# Patient Record
Sex: Female | Born: 2000 | Race: Black or African American | Hispanic: No | Marital: Single | Smoking: Current every day smoker
Health system: Southern US, Community
[De-identification: ages and names within clinical notes are randomized; demographics above are authoritative.]

## PROBLEM LIST (undated history)

## (undated) DIAGNOSIS — E611 Iron deficiency: Secondary | ICD-10-CM

## (undated) DIAGNOSIS — I10 Essential (primary) hypertension: Secondary | ICD-10-CM

## (undated) HISTORY — PX: LEG SURGERY: SHX1003

---
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
## 2020-01-09 NOTE — L&D Delivery Note (Signed)
Patient: Tricia Morrison MRN: [id number]  GBS status: Positive, IAP given PCN. Inadequate prophylaxis. Triple I, given ampicillin and gentamicin   Patient is a 20 y.o. now G1P0 s/p NSVD at [redacted]w[redacted]d, who was admitted for IOL for gHTN. AROM 9h 12m prior to delivery with pink fluid.    Delivery Note At 10:29 AM a healthy female was delivered via Vaginal, Spontaneous (Presentation: Left Occiput Anterior).  APGAR: 7, 9; weight  .   Placenta status: Spontaneous;Pathology, Intact.  Cord: 3 vessels with the following complications: None.  Cord   Anesthesia: Epidural Episiotomy: None Lacerations: Labial;Perineal Suture Repair: 3.0 vicryl Est. Blood Loss (mL): 75  Mom to postpartum.  Baby to Couplet care / Skin to Skin.  Derrel Nip 09/08/2020, 10:59 AM     Head delivered LOA. No nuchal cord present. Shoulder and body delivered in usual fashion. Right arm cord reduced. Infant with spontaneous cry, placed on mother's abdomen, dried and bulb suctioned. Cord clamped x 2 after 1-minute delay, and cut by family member. Cord blood drawn. Placenta delivered spontaneously with gentle cord traction. Fundus firm with massage and Pitocin. Perineum inspected and found to have right labial and superficial perineal laceration, which was repaired with 3-0 vicryl with good hemostasis achieved.

## 2020-01-19 ENCOUNTER — Other Ambulatory Visit: Payer: Self-pay

## 2020-01-19 ENCOUNTER — Ambulatory Visit (INDEPENDENT_AMBULATORY_CARE_PROVIDER_SITE_OTHER): Payer: Medicaid Other

## 2020-01-19 VITALS — BP 107/67 | HR 91 | Ht 61.0 in | Wt 196.0 lb

## 2020-01-19 DIAGNOSIS — N912 Amenorrhea, unspecified: Secondary | ICD-10-CM | POA: Diagnosis not present

## 2020-01-19 DIAGNOSIS — Z3401 Encounter for supervision of normal first pregnancy, first trimester: Secondary | ICD-10-CM | POA: Insufficient documentation

## 2020-01-19 LAB — POCT URINE PREGNANCY: Preg Test, Ur: POSITIVE — AB

## 2020-01-19 MED ORDER — VITAFOL GUMMIES 3.33-0.333-34.8 MG PO CHEW: 3.0000 | CHEWABLE_TABLET | Freq: Every day | ORAL | 11 refills | Status: DC

## 2020-01-19 MED ORDER — BLOOD PRESSURE KIT DEVI: 1.0000 | 0 refills | Status: AC

## 2020-01-19 MED ORDER — DOXYLAMINE-PYRIDOXINE 10-10 MG PO TBEC: 2.0000 | DELAYED_RELEASE_TABLET | Freq: Every day | ORAL | 5 refills | Status: DC

## 2020-01-19 NOTE — Progress Notes (Signed)
  Tricia Morrison presents today for UPT. She has no unusual complaints. Patient complains of having nausea and vomiting and not being able to keep anything down. Diclegis sent to pharmacy per protocol. LMP: 11/23/19    OBJECTIVE: Appears well, in no apparent distress.  OB History    Gravida  1   Para      Term      Preterm      AB      Living        SAB      IAB      Ectopic      Multiple      Live Births             Home UPT Result: positive In-Office UPT result: positive I have reviewed the patient's medical, obstetrical, social, and family histories, and medications.   ASSESSMENT: Positive pregnancy test  PLAN Prenatal care to be completed at: [facility] PHQ2-9 score: 6 GAD 7 score: 7 Baby Scripts ordered Blood pressure kit ordered Diclegis sent to the pharmacy

## 2020-01-20 NOTE — Progress Notes (Signed)
Agree with A & P. 

## 2020-02-01 ENCOUNTER — Ambulatory Visit (INDEPENDENT_AMBULATORY_CARE_PROVIDER_SITE_OTHER): Payer: Medicaid Other

## 2020-02-01 ENCOUNTER — Other Ambulatory Visit: Payer: Self-pay

## 2020-02-01 ENCOUNTER — Other Ambulatory Visit: Payer: Medicaid Other

## 2020-02-01 DIAGNOSIS — O3680X0 Pregnancy with inconclusive fetal viability, not applicable or unspecified: Secondary | ICD-10-CM

## 2020-02-01 DIAGNOSIS — Z3687 Encounter for antenatal screening for uncertain dates: Secondary | ICD-10-CM

## 2020-02-01 NOTE — Progress Notes (Signed)
See note on other encounter.

## 2020-02-01 NOTE — Progress Notes (Signed)
DATING AND VIABILITY SONOGRAM   Tricia Morrison is a 20 y.o. year old G1P0 with LMP Patient's last menstrual period was 11/23/2019. which would correlate to  [redacted]w[redacted]d weeks gestation.  She has regular menstrual cycles.   She is here today for a confirmatory initial sonogram.    GESTATION: [redacted]w[redacted]d SINGLETON pregnancy   FETAL ACTIVITY:          Heart rate         170          The fetus is inactive.  Gestational criteria: Estimated Date of Delivery: 09/10/20 by early ultrasound now at [redacted]w[redacted]d  Previous Scans:0                    CROWN RUMP LENGTH                    8 weeks                                                                               AVERAGE EGA(BY THIS SCAN):  8 weeks  WORKING EDD( early ultrasound ):  09/10/20     TECHNICIAN COMMENTS:  Single live IUP at [redacted]w[redacted]d by CRL. FHR 170 Dates are not consistent with LMP.    A copy of this report including all images has been saved and backed up to a second source for retrieval if needed. All measures and details of the anatomical scan, placentation, fluid volume and pelvic anatomy are contained in that report.  Hamilton Capri 02/01/2020 3:42 PM

## 2020-02-04 NOTE — Progress Notes (Signed)
Patient was assessed and managed by nursing staff during this encounter. I have reviewed the chart and agree with the documentation and plan. I have also made any necessary editorial changes.  Larri Brewton, MD 02/04/2020 8:32 AM    

## 2020-02-10 ENCOUNTER — Encounter: Payer: Medicaid Other | Admitting: Obstetrics

## 2020-02-20 ENCOUNTER — Encounter (HOSPITAL_COMMUNITY): Payer: Self-pay | Admitting: Obstetrics and Gynecology

## 2020-02-20 ENCOUNTER — Inpatient Hospital Stay (HOSPITAL_COMMUNITY)
Admission: AD | Admit: 2020-02-20 | Discharge: 2020-02-20 | Disposition: A | Discharge status: Home / Self Care | Payer: Medicaid Other | Attending: Obstetrics and Gynecology | Admitting: Obstetrics and Gynecology

## 2020-02-20 ENCOUNTER — Other Ambulatory Visit: Payer: Self-pay

## 2020-02-20 DIAGNOSIS — Z79899 Other long term (current) drug therapy: Secondary | ICD-10-CM | POA: Diagnosis not present

## 2020-02-20 DIAGNOSIS — O210 Mild hyperemesis gravidarum: Secondary | ICD-10-CM

## 2020-02-20 DIAGNOSIS — Z3A11 11 weeks gestation of pregnancy: Secondary | ICD-10-CM

## 2020-02-20 DIAGNOSIS — O219 Vomiting of pregnancy, unspecified: Secondary | ICD-10-CM

## 2020-02-20 DIAGNOSIS — Z87891 Personal history of nicotine dependence: Secondary | ICD-10-CM | POA: Insufficient documentation

## 2020-02-20 LAB — URINALYSIS, MICROSCOPIC (REFLEX): Bacteria, UA: NONE SEEN

## 2020-02-20 LAB — URINALYSIS, ROUTINE W REFLEX MICROSCOPIC
Glucose, UA: NEGATIVE mg/dL
Ketones, ur: >80 mg/dL — AB
Leukocytes,Ua: NEGATIVE
Nitrite: NEGATIVE
Protein, ur: NEGATIVE mg/dL
Specific Gravity, Urine: >1.03 — ABNORMAL HIGH (ref 1.005–1.030)
pH: 6 (ref 5.0–8.0)

## 2020-02-20 MED ORDER — FAMOTIDINE 20 MG PO TABS: 20.0000 mg | ORAL_TABLET | Freq: Two times a day (BID) | ORAL | 0 refills | Status: DC

## 2020-02-20 MED ORDER — ONDANSETRON 4 MG PO TBDP: 4.0000 mg | ORAL_TABLET | Freq: Three times a day (TID) | ORAL | 0 refills | Status: DC | PRN

## 2020-02-20 MED ORDER — ONDANSETRON 4 MG PO TBDP
8.0000 mg | ORAL_TABLET | Freq: Once | ORAL | Status: AC
  Administered 2020-02-20: 8 mg via ORAL
  Filled 2020-02-20: qty 2

## 2020-02-20 MED ORDER — PROMETHAZINE HCL 25 MG PO TABS: 25.0000 mg | ORAL_TABLET | Freq: Four times a day (QID) | ORAL | 0 refills | Status: DC | PRN

## 2020-02-20 NOTE — Discharge Instructions (Signed)

## 2020-02-20 NOTE — MAU Provider Note (Signed)
History     CSN: [id number]  Arrival date and time: 02/20/20 1617   Event Date/Time   First Provider Initiated Contact with Patient 02/20/20 1715      Chief Complaint  Patient presents with  . Nausea  . Emesis   HPI Tricia Morrison is a 20 y.o. G1P0000 at [redacted]w[redacted]d who presents with nausea and vomiting. She states she has vomited 5 times since yesterday. She reports she was taking diclegis but stopped several weeks ago because she felt like it wasn't helping. She denies any abdominal pain, vaginal bleeding or discharge. She is resting comfortably in the bed with her significant other.   OB History    Gravida  1   Para  0   Term  0   Preterm  0   AB  0   Living  0     SAB  0   IAB  0   Ectopic  0   Multiple  0   Live Births  0           History reviewed. No pertinent past medical history.  Past Surgical History:  Procedure Laterality Date  . LEG SURGERY Right    femur fx repair    History reviewed. No pertinent family history.  Social History   Tobacco Use  . Smoking status: Former Research scientist (life sciences)  . Smokeless tobacco: Never Used  . Tobacco comment: cigars/ last smoked last week  Vaping Use  . Vaping Use: Never used  Substance Use Topics  . Alcohol use: Not Currently  . Drug use: Not Currently    Types: Marijuana    Comment: Last used December 2021    Allergies: No Known Allergies  Medications Prior to Admission  Medication Sig Dispense Refill Last Dose  . Doxylamine-Pyridoxine (DICLEGIS) 10-10 MG TBEC Take 2 tablets by mouth at bedtime. If symptoms persist, add one tablet in the morning and one in the afternoon 100 tablet 5 Past Month at Unknown time  . Prenatal Vit-Fe Phos-FA-Omega (VITAFOL GUMMIES) 3.33-0.333-34.8 MG CHEW Chew 3 tablets by mouth daily. 90 tablet 11 02/19/2020 at Unknown time  . Blood Pressure Monitoring (BLOOD PRESSURE KIT) DEVI 1 kit by Does not apply route once a week. 1 each 0     Review of Systems  Constitutional:  Negative.  Negative for fatigue and fever.  HENT: Negative.   Respiratory: Negative.  Negative for shortness of breath.   Cardiovascular: Negative.  Negative for chest pain.  Gastrointestinal: Positive for nausea and vomiting. Negative for abdominal pain, constipation and diarrhea.  Genitourinary: Negative.  Negative for dysuria, vaginal bleeding and vaginal discharge.  Neurological: Negative.  Negative for dizziness and headaches.   Physical Exam   Blood pressure 114/81, pulse 85, temperature 98.7 F (37.1 C), temperature source Oral, resp. rate 16, height $RemoveBe'5\' 1"'AxBsbSgds$  (1.549 m), weight 86.3 kg, last menstrual period 11/23/2019, SpO2 98 %.  Physical Exam Vitals and nursing note reviewed.  Constitutional:      General: She is not in acute distress.    Appearance: She is well-developed and well-nourished.  HENT:     Head: Normocephalic.  Eyes:     Pupils: Pupils are equal, round, and reactive to light.  Cardiovascular:     Rate and Rhythm: Normal rate and regular rhythm.     Heart sounds: Normal heart sounds.  Pulmonary:     Effort: Pulmonary effort is normal. No respiratory distress.     Breath sounds: Normal breath sounds.  Abdominal:  General: Bowel sounds are normal. There is no distension.     Palpations: Abdomen is soft.     Tenderness: There is no abdominal tenderness.  Skin:    General: Skin is warm and dry.  Neurological:     Mental Status: She is alert and oriented to person, place, and time.  Psychiatric:        Mood and Affect: Mood and affect normal.        Behavior: Behavior normal.        Thought Content: Thought content normal.        Judgment: Judgment normal.    FHT: 167 bpm  MAU Course  Procedures Results for orders placed or performed during the hospital encounter of 02/20/20 (from the past 24 hour(s))  Urinalysis, Routine w reflex microscopic Urine, Clean Catch     Status: Abnormal   Collection Time: 02/20/20  4:50 PM  Result Value Ref Range   Color,  Urine YELLOW YELLOW   APPearance CLEAR CLEAR   Specific Gravity, Urine >1.030 (H) 1.005 - 1.030   pH 6.0 5.0 - 8.0   Glucose, UA NEGATIVE NEGATIVE mg/dL   Hgb urine dipstick TRACE (A) NEGATIVE   Bilirubin Urine SMALL (A) NEGATIVE   Ketones, ur >80 (A) NEGATIVE mg/dL   Protein, ur NEGATIVE NEGATIVE mg/dL   Nitrite NEGATIVE NEGATIVE   Leukocytes,Ua NEGATIVE NEGATIVE  Urinalysis, Microscopic (reflex)     Status: None   Collection Time: 02/20/20  4:50 PM  Result Value Ref Range   RBC / HPF 0-5 0 - 5 RBC/hpf   WBC, UA 0-5 0 - 5 WBC/hpf   Bacteria, UA NONE SEEN NONE SEEN   Squamous Epithelial / LPF 6-10 0 - 5   Mucus PRESENT    Hyaline Casts, UA PRESENT    MDM UA No episodes of vomiting in MAU PO zofran-patient reports improvement  Offered IV fluids due to UA results, patient declined and desires further PO management at home.   Assessment and Plan   1. Nausea/vomiting in pregnancy   2. [redacted] weeks gestation of pregnancy    -Discharge home in stable condition -Rx for zofran and phenergan sent to patient's pharmacy -Nausea and vomiting precautions discussed -Patient advised to follow-up with OB as scheduled for prenatal care -Patient may return to MAU as needed or if her condition were to change or worsen   Wende Mott CNM 02/20/2020, 5:15 PM

## 2020-02-20 NOTE — MAU Note (Signed)
Tricia Morrison is a 20 y.o. at [redacted]w[redacted]d here in MAU reporting: states for the past week she has not been able to keep anything down. Emesis x 5 since yesterday.  Onset of complaint: ongoing  Pain score: 0/10  Vitals:   02/20/20 1631  BP: 114/81  Pulse: 85  Resp: 16  Temp: 98.7 F (37.1 C)  SpO2: 98%     Lab orders placed from triage: UA

## 2020-02-23 ENCOUNTER — Ambulatory Visit (INDEPENDENT_AMBULATORY_CARE_PROVIDER_SITE_OTHER): Payer: Medicaid Other | Admitting: Obstetrics

## 2020-02-23 ENCOUNTER — Other Ambulatory Visit (HOSPITAL_COMMUNITY)
Admission: RE | Admit: 2020-02-23 | Discharge: 2020-02-23 | Disposition: A | Discharge status: Home / Self Care | Payer: Medicaid Other | Source: Ambulatory Visit | Attending: Obstetrics | Admitting: Obstetrics

## 2020-02-23 ENCOUNTER — Encounter: Payer: Self-pay | Admitting: Obstetrics

## 2020-02-23 ENCOUNTER — Other Ambulatory Visit: Payer: Self-pay

## 2020-02-23 VITALS — BP 127/74 | HR 83 | Wt 190.0 lb

## 2020-02-23 DIAGNOSIS — Z3480 Encounter for supervision of other normal pregnancy, unspecified trimester: Secondary | ICD-10-CM | POA: Diagnosis not present

## 2020-02-23 DIAGNOSIS — O368390 Maternal care for abnormalities of the fetal heart rate or rhythm, unspecified trimester, not applicable or unspecified: Secondary | ICD-10-CM

## 2020-02-23 DIAGNOSIS — Z3A11 11 weeks gestation of pregnancy: Secondary | ICD-10-CM

## 2020-02-23 NOTE — Progress Notes (Signed)
Subjective:    Tricia Morrison is being seen today for her first obstetrical visit.  This is not a planned pregnancy. She is at [redacted]w[redacted]d gestation. Her obstetrical history is significant for obesity. Relationship with FOB: significant other, not living together. Patient does intend to breast feed. Pregnancy history fully reviewed.  The information documented in the HPI was reviewed and verified.  Menstrual History: OB History    Gravida  1   Para  0   Term  0   Preterm  0   AB  0   Living  0     SAB  0   IAB  0   Ectopic  0   Multiple  0   Live Births  0            Patient's last menstrual period was 11/23/2019.    History reviewed. No pertinent past medical history.  Past Surgical History:  Procedure Laterality Date  . LEG SURGERY Right    femur fx repair    (Not in a hospital admission)  No Known Allergies  Social History   Tobacco Use  . Smoking status: Former Games developer  . Smokeless tobacco: Never Used  . Tobacco comment: cigars/ last smoked last week  Substance Use Topics  . Alcohol use: Not Currently    History reviewed. No pertinent family history.   Review of Systems Constitutional: negative for weight loss Gastrointestinal:  positive for nausea / vomiting Genitourinary:negative for genital lesions and vaginal discharge and dysuria Musculoskeletal:negative for back pain Behavioral/Psych: negative for abusive relationship, depression, illegal drug usage and tobacco use    Objective:    BP 127/74   Pulse 83   Wt 190 lb (86.2 kg)   LMP 11/23/2019   BMI 35.90 kg/m  General Appearance:    Alert, cooperative, no distress, appears stated age  Head:    Normocephalic, without obvious abnormality, atraumatic  Eyes:    PERRL, conjunctiva/corneas clear, EOM's intact, fundi    benign, both eyes  Ears:    Normal TM's and external ear canals, both ears  Nose:   Nares normal, septum midline, mucosa normal, no drainage    or sinus tenderness  Throat:    Lips, mucosa, and tongue normal; teeth and gums normal  Neck:   Supple, symmetrical, trachea midline, no adenopathy;    thyroid:  no enlargement/tenderness/nodules; no carotid   bruit or JVD  Back:     Symmetric, no curvature, ROM normal, no CVA tenderness  Lungs:     Clear to auscultation bilaterally, respirations unlabored  Chest Wall:    No tenderness or deformity   Heart:    Regular rate and rhythm, S1 and S2 normal, no murmur, rub   or gallop  Breast Exam:    No tenderness, masses, or nipple abnormality  Abdomen:     Soft, non-tender, bowel sounds active all four quadrants,    no masses, no organomegaly  Genitalia:    Normal female without lesion, discharge or tenderness  Extremities:   Extremities normal, atraumatic, no cyanosis or edema  Pulses:   2+ and symmetric all extremities  Skin:   Skin color, texture, turgor normal, no rashes or lesions  Lymph nodes:   Cervical, supraclavicular, and axillary nodes normal  Neurologic:   CNII-XII intact, normal strength, sensation and reflexes    throughout      Lab Review Urine pregnancy test Labs reviewed yes Radiologic studies reviewed yes  Assessment:    Pregnancy at [redacted]w[redacted]d weeks  Plan:      Prenatal vitamins.  Counseling provided regarding continued use of seat belts, cessation of alcohol consumption, smoking or use of illicit drugs; infection precautions i.e., influenza/TDAP immunizations, toxoplasmosis,CMV, parvovirus, listeria and varicella; workplace safety, exercise during pregnancy; routine dental care, safe medications, sexual activity, hot tubs, saunas, pools, travel, caffeine use, fish and methlymercury, potential toxins, hair treatments, varicose veins Weight gain recommendations per IOM guidelines reviewed: underweight/BMI< 18.5--> gain 28 - 40 lbs; normal weight/BMI 18.5 - 24.9--> gain 25 - 35 lbs; overweight/BMI 25 - 29.9--> gain 15 - 25 lbs; obese/BMI >30->gain  11 - 20 lbs Problem list reviewed and  updated. FIRST/CF mutation testing/NIPT/QUAD SCREEN/fragile X/Ashkenazi Jewish population testing/Spinal muscular atrophy discussed: requested. Role of ultrasound in pregnancy discussed; fetal survey: requested. Amniocentesis discussed: not indicated.   Orders Placed This Encounter  Procedures  . Culture, OB Urine  . [place] MFM OB COMP + 14 WK    Standing Status:   Future    Standing Expiration Date:   02/22/2021    Order Specific Question:   Reason for Exam (SYMPTOM  OR DIAGNOSIS REQUIRED)    Answer:   Anatomy    Order Specific Question:   Preferred Location    Answer:   [facility] Ultrasound  . CBC/D/Plt+RPR+Rh+ABO+Rub Ab...  . Genetic Screening    Follow up in 4 weeks. 50% of 25 min visit spent on counseling and coordination of care.    Brock Bad, MD 02/23/2020 2:10 PM

## 2020-02-24 LAB — CERVICOVAGINAL ANCILLARY ONLY
Bacterial Vaginitis (gardnerella): POSITIVE — AB
Candida Glabrata: NEGATIVE
Candida Vaginitis: POSITIVE — AB
Chlamydia: NEGATIVE
Comment: NEGATIVE
Comment: NEGATIVE
Comment: NEGATIVE
Comment: NEGATIVE
Comment: NEGATIVE
Comment: NORMAL
Neisseria Gonorrhea: NEGATIVE
Trichomonas: NEGATIVE

## 2020-02-25 ENCOUNTER — Telehealth: Payer: Self-pay

## 2020-02-25 ENCOUNTER — Other Ambulatory Visit: Payer: Self-pay | Admitting: Obstetrics

## 2020-02-25 DIAGNOSIS — N760 Acute vaginitis: Secondary | ICD-10-CM

## 2020-02-25 DIAGNOSIS — B9689 Other specified bacterial agents as the cause of diseases classified elsewhere: Secondary | ICD-10-CM

## 2020-02-25 LAB — CULTURE, OB URINE

## 2020-02-25 LAB — URINE CULTURE, OB REFLEX

## 2020-02-25 MED ORDER — METRONIDAZOLE 500 MG PO TABS: 500.0000 mg | ORAL_TABLET | Freq: Two times a day (BID) | ORAL | 2 refills | Status: DC

## 2020-02-25 NOTE — Telephone Encounter (Signed)
Called to advise of results, no answer, left vm ?

## 2020-02-26 LAB — CBC/D/PLT+RPR+RH+ABO+RUB AB...
Antibody Screen: NEGATIVE
Basophils Absolute: 0 10*3/uL (ref 0.0–0.2)
Basos: 0 %
EOS (ABSOLUTE): 0 10*3/uL (ref 0.0–0.4)
Eos: 0 %
HCV Ab: <0.1 s/co ratio (ref 0.0–0.9)
HIV Screen 4th Generation wRfx: NONREACTIVE
Hematocrit: 36.7 % (ref 34.0–46.6)
Hemoglobin: 12 g/dL (ref 11.1–15.9)
Hepatitis B Surface Ag: NEGATIVE
Immature Grans (Abs): 0 10*3/uL (ref 0.0–0.1)
Immature Granulocytes: 0 %
Lymphocytes Absolute: 2.6 10*3/uL (ref 0.7–3.1)
Lymphs: 32 %
MCH: 26.3 pg — ABNORMAL LOW (ref 26.6–33.0)
MCHC: 32.7 g/dL (ref 31.5–35.7)
MCV: 80 fL (ref 79–97)
Monocytes Absolute: 1.1 10*3/uL — ABNORMAL HIGH (ref 0.1–0.9)
Monocytes: 13 %
Neutrophils Absolute: 4.3 10*3/uL (ref 1.4–7.0)
Neutrophils: 55 %
Platelets: 513 10*3/uL — ABNORMAL HIGH (ref 150–450)
RBC: 4.57 x10E6/uL (ref 3.77–5.28)
RDW: 13.9 % (ref 11.7–15.4)
RPR Ser Ql: REACTIVE — AB
Rh Factor: POSITIVE
Rubella Antibodies, IGG: 7.26 index (ref >0.99)
WBC: 8 10*3/uL (ref 3.4–10.8)

## 2020-02-26 LAB — RPR, QUANT+TP ABS (REFLEX)
Rapid Plasma Reagin, Quant: 1:2 {titer} — ABNORMAL HIGH
T Pallidum Abs: NONREACTIVE

## 2020-02-26 LAB — HCV INTERPRETATION

## 2020-03-01 ENCOUNTER — Encounter: Payer: Self-pay | Admitting: Obstetrics

## 2020-03-15 ENCOUNTER — Encounter: Payer: Self-pay | Admitting: Obstetrics

## 2020-03-18 ENCOUNTER — Inpatient Hospital Stay (HOSPITAL_COMMUNITY)
Admission: AD | Admit: 2020-03-18 | Discharge: 2020-03-18 | Disposition: A | Discharge status: Home / Self Care | Payer: Medicaid Other | Attending: Obstetrics & Gynecology | Admitting: Obstetrics & Gynecology

## 2020-03-18 ENCOUNTER — Other Ambulatory Visit: Payer: Self-pay

## 2020-03-18 DIAGNOSIS — Z3A14 14 weeks gestation of pregnancy: Secondary | ICD-10-CM | POA: Diagnosis not present

## 2020-03-18 DIAGNOSIS — O219 Vomiting of pregnancy, unspecified: Secondary | ICD-10-CM

## 2020-03-18 MED ORDER — ONDANSETRON 4 MG PO TBDP: 4.0000 mg | ORAL_TABLET | Freq: Three times a day (TID) | ORAL | 2 refills | Status: DC | PRN

## 2020-03-18 MED ORDER — PROMETHAZINE HCL 25 MG PO TABS: 25.0000 mg | ORAL_TABLET | Freq: Four times a day (QID) | ORAL | 2 refills | Status: DC | PRN

## 2020-03-18 MED ORDER — DOCUSATE SODIUM 100 MG PO CAPS: 100.0000 mg | ORAL_CAPSULE | Freq: Two times a day (BID) | ORAL | 0 refills | Status: AC

## 2020-03-18 NOTE — MAU Provider Note (Signed)
Event Date/Time   First Provider Initiated Contact with Patient 03/18/20 1538     S [name] is a 20 y.o. G1P0000 pregnant female at [redacted]w[redacted]d who presents to MAU today with complaint of nausea/vomiting and is out of her meds. She tried calling [facility] but states she has not gotten a call back. Has thrown up once today. Denies vaginal bleeding/discharge or abdominal pain, does have some constipation.   O BP 116/69 (BP Location: Right Arm)   Pulse (!) 108   Temp 97.9 F (36.6 C) (Oral)   Resp 17   Ht 5\' 1"  (1.549 m)   Wt 194 lb 6.4 oz (88.2 kg)   LMP 11/23/2019   SpO2 99%   BMI 36.73 kg/m  Physical Exam Vitals and nursing note reviewed.  Constitutional:      General: She is not in acute distress.    Appearance: She is not ill-appearing.  HENT:     Mouth/Throat:     Mouth: Mucous membranes are moist.  Eyes:     Pupils: Pupils are equal, round, and reactive to light.  Cardiovascular:     Rate and Rhythm: Normal rate.     Pulses: Normal pulses.  Pulmonary:     Effort: Pulmonary effort is normal.  Musculoskeletal:        General: Normal range of motion.  Skin:    General: Skin is warm and dry.     Capillary Refill: Capillary refill takes less than 2 seconds.     Coloration: Skin is not pale.  Neurological:     Mental Status: She is alert and oriented to person, place, and time.  Psychiatric:        Mood and Affect: Mood normal.        Behavior: Behavior normal.        Thought Content: Thought content normal.        Judgment: Judgment normal.    A Nausea and vomiting in pregnancy Medical screening exam complete  P Discharge from MAU in stable condition Refills for nausea meds and new script for stool softener sent to pharmacy Pt has appt at [facility] with Dr [facility] on 03/22/20 Warning signs for worsening condition that would warrant emergency follow-up discussed Patient may return to MAU as needed for emergent OB/GYN related complaints  03/24/20,  CNM 03/18/2020 3:55 PM

## 2020-03-18 NOTE — Discharge Instructions (Signed)
Morning Sickness  Morning sickness is when you feel like you may vomit (feel nauseous) during pregnancy. Sometimes, you may vomit. Morning sickness most often happens in the morning, but it can also happen at any time of the day. Some women may have morning sickness that makes them vomit all the time. This is a more serious problem that needs treatment. What are the causes? The cause of this condition is not known. What increases the risk?  You had vomiting or a feeling like you may vomit before your pregnancy.  You had morning sickness in another pregnancy.  You are pregnant with more than one baby, such as twins. What are the signs or symptoms?  Feeling like you may vomit.  Vomiting. How is this treated? Treatment is usually not needed for this condition. You may only need to change what you eat. In some cases, your doctor may give you some things to take for your condition. These include:  Vitamin B6 supplements.  Medicines to treat the feeling that you may vomit.  Ginger. Follow these instructions at home: Medicines  Take over-the-counter and prescription medicines only as told by your doctor. Do not take any medicines until you talk with your doctor about them first.  Take multivitamins before you get pregnant. These can stop or lessen the symptoms of morning sickness. Eating and drinking  Eat dry toast or crackers before getting out of bed.  Eat 5 or 6 small meals a day.  Eat dry and bland foods like rice and baked potatoes.  Do not eat greasy, fatty, or spicy foods.  Have someone cook for you if the smell of food causes you to vomit or to feel like you may vomit.  If you feel like you may vomit after taking prenatal vitamins, take them at night or with a snack.  Eat protein foods when you need a snack. Nuts, yogurt, and cheese are good choices.  Drink fluids throughout the day.  Try ginger ale made with real ginger, ginger tea made from fresh grated ginger, or  ginger candies. General instructions  Do not smoke or use any products that contain nicotine or tobacco. If you need help quitting, ask your doctor.  Use an air purifier to keep the air in your house free of smells.  Get lots of fresh air.  Try to avoid smells that make you feel sick.  Try wearing an acupressure wristband. This is a wristband that is used to treat seasickness.  Try a treatment called acupuncture. In this treatment, a doctor puts needles into certain areas of your body to make you feel better. Contact a doctor if:  You need medicine to feel better.  You feel dizzy or light-headed.  You are losing weight. Get help right away if:  The feeling that you may vomit will not go away, or you cannot stop vomiting.  You faint.  You have very bad pain in your belly. Summary  Morning sickness is when you feel like you may vomit (feel nauseous) during pregnancy.  You may feel sick in the morning, but you can feel this way at any time of the day.  Making some changes to what you eat may help your symptoms go away. This information is not intended to replace advice given to you by your health care provider. Make sure you discuss any questions you have with your health care provider. Document Revised: 08/10/2019 Document Reviewed: 07/20/2019 Elsevier Patient Education  2021 Elsevier Inc.  

## 2020-03-18 NOTE — MAU Note (Signed)
Was here about 3 wks ago, was prescribed something for nausea.  Ran out, and symptoms have returned.  Needs a refill. Did not call office, tried to just walk in at pharmacy and get refill was  Unable to get it.  Has thrown up once today. Last tried to eat some yogurt this morning.

## 2020-03-22 ENCOUNTER — Other Ambulatory Visit: Payer: Self-pay

## 2020-03-22 ENCOUNTER — Ambulatory Visit (INDEPENDENT_AMBULATORY_CARE_PROVIDER_SITE_OTHER): Payer: Medicaid Other | Admitting: Obstetrics and Gynecology

## 2020-03-22 VITALS — BP 119/68 | HR 100 | Wt 196.0 lb

## 2020-03-22 DIAGNOSIS — Z3480 Encounter for supervision of other normal pregnancy, unspecified trimester: Secondary | ICD-10-CM

## 2020-03-22 DIAGNOSIS — Z3A15 15 weeks gestation of pregnancy: Secondary | ICD-10-CM

## 2020-03-22 DIAGNOSIS — O219 Vomiting of pregnancy, unspecified: Secondary | ICD-10-CM

## 2020-03-22 NOTE — Progress Notes (Signed)
   PRENATAL VISIT NOTE  Subjective:  Tricia Morrison is a 20 y.o. G1P0000 at [redacted]w[redacted]d being seen today for ongoing prenatal care.  She is currently monitored for the following issues for this low-risk pregnancy and has Encounter for supervision of normal first pregnancy in first trimester on their problem list.  Patient reports no complaints.  Contractions: Not present. Vag. Bleeding: None.   . Denies leaking of fluid.   The following portions of the patient's history were reviewed and updated as appropriate: allergies, current medications, past family history, past medical history, past social history, past surgical history and problem list.   Objective:   Vitals:   03/22/20 1306  BP: 119/68  Pulse: 100  Weight: 196 lb (88.9 kg)    Fetal Status: Fetal Heart Rate (bpm): 145         General:  Alert, oriented and cooperative. Patient is in no acute distress.  Skin: Skin is warm and dry. No rash noted.   Cardiovascular: Normal heart rate noted  Respiratory: Normal respiratory effort, no problems with respiration noted  Abdomen: Soft, gravid, appropriate for gestational age.  Pain/Pressure: Present     Pelvic: Cervical exam deferred        Extremities: Normal range of motion.  Edema: None  Mental Status: Normal mood and affect. Normal behavior. Normal judgment and thought content.   Assessment and Plan:  Pregnancy: G1P0000 at [redacted]w[redacted]d 1. Supervision of other normal pregnancy, antepartum -doing well. Anatomy scan scheduled for April 11th.   2. [redacted] weeks gestation of pregnancy    3. Nausea and vomiting during pregnancy -has greatly improved. Tolerating PO with minimal nausea.   Preterm labor symptoms and general obstetric precautions including but not limited to vaginal bleeding, contractions, leaking of fluid and fetal movement were reviewed in detail with the patient. Please refer to After Visit Summary for other counseling recommendations.   Return in about 4 weeks (around  04/19/2020) for OB.  Future Appointments  Date Time Provider Department Center  04/18/2020  1:30 PM [facility] US3 [facility]  04/19/2020  1:40 PM Burleson, Brand Males, NP [facility] None    Gita Kudo, MD

## 2020-04-18 ENCOUNTER — Other Ambulatory Visit: Payer: Self-pay

## 2020-04-18 ENCOUNTER — Other Ambulatory Visit: Payer: Self-pay | Admitting: Obstetrics

## 2020-04-18 ENCOUNTER — Ambulatory Visit: Payer: Medicaid Other | Attending: Obstetrics

## 2020-04-18 DIAGNOSIS — Z3480 Encounter for supervision of other normal pregnancy, unspecified trimester: Secondary | ICD-10-CM

## 2020-04-19 ENCOUNTER — Other Ambulatory Visit: Payer: Self-pay | Admitting: *Deleted

## 2020-04-19 ENCOUNTER — Encounter: Payer: Self-pay | Admitting: Nurse Practitioner

## 2020-04-19 ENCOUNTER — Ambulatory Visit (INDEPENDENT_AMBULATORY_CARE_PROVIDER_SITE_OTHER): Payer: Medicaid Other

## 2020-04-19 VITALS — BP 119/69 | HR 82 | Wt 192.0 lb

## 2020-04-19 DIAGNOSIS — Z3A19 19 weeks gestation of pregnancy: Secondary | ICD-10-CM

## 2020-04-19 DIAGNOSIS — Z3401 Encounter for supervision of normal first pregnancy, first trimester: Secondary | ICD-10-CM

## 2020-04-19 DIAGNOSIS — Z362 Encounter for other antenatal screening follow-up: Secondary | ICD-10-CM

## 2020-04-19 NOTE — Progress Notes (Signed)
   PRENATAL VISIT NOTE  Subjective:  Tricia Morrison is a 20 y.o. G1P0000 at [redacted]w[redacted]d being seen today for ongoing prenatal care.  She is currently monitored for the following issues for this low-risk pregnancy and has Encounter for supervision of normal first pregnancy in first trimester on their problem list.  Patient reports no complaints.  Contractions: Not present. Vag. Bleeding: None.  Movement: Present. Denies leaking of fluid.   The following portions of the patient's history were reviewed and updated as appropriate: allergies, current medications, past family history, past medical history, past social history, past surgical history and problem list.   Objective:   Vitals:   04/19/20 1357  BP: 119/69  Pulse: 82  Weight: 192 lb (87.1 kg)    Fetal Status: Fetal Heart Rate (bpm): 142   Movement: Present     General:  Alert, oriented and cooperative. Patient is in no acute distress.  Skin: Skin is warm and dry. No rash noted.   Cardiovascular: Normal heart rate noted  Respiratory: Normal respiratory effort, no problems with respiration noted  Abdomen: Soft, gravid, appropriate for gestational age.  Pain/Pressure: Absent     Pelvic: Cervical exam deferred        Extremities: Normal range of motion.  Edema: None  Mental Status: Normal mood and affect. Normal behavior. Normal judgment and thought content.   Assessment and Plan:  Pregnancy: G1P0000 at [redacted]w[redacted]d 1. Encounter for supervision of normal first pregnancy in first trimester - Doing well, no complaints - Anticipatory guidance for upcoming prenatal appointment - AFP, Serum, Open Spina Bifida  2. [redacted] weeks gestation of pregnancy    Preterm labor symptoms and general obstetric precautions including but not limited to vaginal bleeding, contractions, leaking of fluid and fetal movement were reviewed in detail with the patient. Please refer to After Visit Summary for other counseling recommendations.   Return in about 4 weeks  (around 05/17/2020) for ROB.  Future Appointments  Date Time Provider Department Center  05/03/2020  1:00 PM [facility] GENETIC COUNSELING RM [facility]  05/24/2020  1:00 PM [facility] NURSE [facility]  05/24/2020  1:15 PM [facility]2 [facility]     Brand Males, CNM 2:28 PM

## 2020-04-19 NOTE — Progress Notes (Signed)
ROB [redacted]w[redacted]d AFP today PHQ9=0   CC: None

## 2020-04-21 LAB — AFP, SERUM, OPEN SPINA BIFIDA
AFP MoM: 1.42
AFP Value: 69.7 ng/mL
Gest. Age on Collection Date: 19 weeks
Maternal Age At EDD: 21.1 yr
OSBR Risk 1 IN: 6808
Test Results:: NEGATIVE
Weight: 192 [lb_av]

## 2020-05-03 ENCOUNTER — Ambulatory Visit: Payer: No Typology Code available for payment source

## 2020-05-17 ENCOUNTER — Other Ambulatory Visit: Payer: Self-pay

## 2020-05-17 ENCOUNTER — Ambulatory Visit (INDEPENDENT_AMBULATORY_CARE_PROVIDER_SITE_OTHER): Payer: Medicaid Other | Admitting: Obstetrics & Gynecology

## 2020-05-17 DIAGNOSIS — Z3401 Encounter for supervision of normal first pregnancy, first trimester: Secondary | ICD-10-CM

## 2020-05-17 MED ORDER — PRENATAL PLUS VITAMIN/MINERAL 27-1 MG PO TABS: 1.0000 | ORAL_TABLET | Freq: Every day | ORAL | 4 refills | Status: DC

## 2020-05-17 NOTE — Progress Notes (Signed)
   PRENATAL VISIT NOTE  Subjective:  Tricia Morrison is a 20 y.o. G1P0000 at [redacted]w[redacted]d being seen today for ongoing prenatal care.  She is currently monitored for the following issues for this low-risk pregnancy and has Encounter for supervision of normal first pregnancy in first trimester on their problem list.  Patient reports heartburn and nausea.  Contractions: Not present. Vag. Bleeding: None.  Movement: Present. Denies leaking of fluid.   The following portions of the patient's history were reviewed and updated as appropriate: allergies, current medications, past family history, past medical history, past social history, past surgical history and problem list.   Objective:   Vitals:   05/17/20 1323  BP: 123/79  Pulse: 90  Weight: 194 lb (88 kg)    Fetal Status: Fetal Heart Rate (bpm): 148   Movement: Present     General:  Alert, oriented and cooperative. Patient is in no acute distress.  Skin: Skin is warm and dry. No rash noted.   Cardiovascular: Normal heart rate noted  Respiratory: Normal respiratory effort, no problems with respiration noted  Abdomen: Soft, gravid, appropriate for gestational age.  Pain/Pressure: Absent     Pelvic: Cervical exam deferred        Extremities: Normal range of motion.  Edema: None  Mental Status: Normal mood and affect. Normal behavior. Normal judgment and thought content.   Assessment and Plan:  Pregnancy: G1P0000 at [redacted]w[redacted]d 1. Encounter for supervision of normal first pregnancy in first trimester Change to different vitamin due to nausea - Prenatal Vit-Fe Fumarate-FA (PRENATAL PLUS VITAMIN/MINERAL) 27-1 MG TABS; Take 1 tablet by mouth daily.  Dispense: 30 tablet; Refill: 4  Preterm labor symptoms and general obstetric precautions including but not limited to vaginal bleeding, contractions, leaking of fluid and fetal movement were reviewed in detail with the patient. Please refer to After Visit Summary for other counseling recommendations.    Return in about 4 weeks (around 06/14/2020) for 2 hr GTT.  Future Appointments  Date Time Provider Department Center  05/24/2020  1:00 PM [facility] NURSE [facility]  05/24/2020  1:15 PM [facility] US2 [facility]  05/24/2020  2:30 PM [facility] GENETIC COUNSELING RM [facility]    Scheryl Darter, MD

## 2020-05-17 NOTE — Patient Instructions (Signed)

## 2020-05-24 ENCOUNTER — Ambulatory Visit: Payer: Medicaid Other | Admitting: *Deleted

## 2020-05-24 ENCOUNTER — Ambulatory Visit (HOSPITAL_BASED_OUTPATIENT_CLINIC_OR_DEPARTMENT_OTHER): Payer: Medicaid Other

## 2020-05-24 ENCOUNTER — Other Ambulatory Visit: Payer: Self-pay | Admitting: *Deleted

## 2020-05-24 ENCOUNTER — Ambulatory Visit: Payer: Medicaid Other | Attending: Maternal & Fetal Medicine

## 2020-05-24 ENCOUNTER — Other Ambulatory Visit: Payer: Self-pay

## 2020-05-24 VITALS — BP 131/71 | HR 89

## 2020-05-24 DIAGNOSIS — Z148 Genetic carrier of other disease: Secondary | ICD-10-CM

## 2020-05-24 DIAGNOSIS — Z362 Encounter for other antenatal screening follow-up: Secondary | ICD-10-CM | POA: Insufficient documentation

## 2020-05-24 DIAGNOSIS — Z6838 Body mass index (BMI) 38.0-38.9, adult: Secondary | ICD-10-CM | POA: Insufficient documentation

## 2020-05-24 DIAGNOSIS — E669 Obesity, unspecified: Secondary | ICD-10-CM | POA: Diagnosis not present

## 2020-05-24 DIAGNOSIS — O3662X0 Maternal care for excessive fetal growth, second trimester, not applicable or unspecified: Secondary | ICD-10-CM | POA: Diagnosis not present

## 2020-05-24 DIAGNOSIS — O99212 Obesity complicating pregnancy, second trimester: Secondary | ICD-10-CM | POA: Diagnosis not present

## 2020-05-24 DIAGNOSIS — Z3A24 24 weeks gestation of pregnancy: Secondary | ICD-10-CM

## 2020-05-24 DIAGNOSIS — O09523 Supervision of elderly multigravida, third trimester: Secondary | ICD-10-CM

## 2020-05-24 NOTE — Progress Notes (Signed)
Referring practice:  Dr. Clearance Coots, [facility] Length of consultation:  30 minutes  Ms. 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 Additional screening for CF and HBB were negative as was aneuploidy screening through [employer].  These results were review with the patient and her partner, Jabier Gauss, at this visit.  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 SMA is characterized by progressive muscle weakness and atrophy due to degeneration and loss of anterior horn cells (lower motor neurons) in the spinal cord and brain stem. We discussed the different types of SMA (0, I, II, and III), including differences in severity and age of onset.    Pathogenic variants in the SMN1 gene cause SMA. The number of copies of another related gene called SMN2 modifies the severity of the condition and helps determine which form of SMA an affected individual will develop. The SMN1 and SMN2 genes both provide instructions for the survival motor neuron (SMN) protein. This protein has important functions in the maintenance of motor neurons in the skeletal muscles that allow the body to move. Typically, most functional SMN protein is produced from the SMN1 gene, with a small amount produced from the SMN2 gene. Most individuals with SMA are missing a piece of the SMN1 gene which impairs SMN protein production. A shortage of SMN protein causes progressive motor neurons death, leading to the signs and symptoms of SMA. However, the SMN protein produced by the SMN2 gene can help make up for the protein deficiency caused by pathogenic variants in SMN1. Having multiple copies of the SMN2 gene is usually associated with less severe features and later onset of symptoms.   We reviewed that SMA is inherited  in an autosomal recessive pattern. This means that both parents must be carriers for the condition to be at risk of having an affected child. Based on the carrier frequency for SMA in the [facility] population, Ms. Roger's partner currently has a 1 in 27 chance of being a carrier of SMA. If Mr. Jenelle Mages is found to have 2 or more copies of SMN1 on carrier screening, his risk of being a carrier is reduced but not eliminated. If both parents are carriers of SMA, there is a 1 in 4 (25%) chance of having an affected fetus with each pregnancy. We discussed that partner carrier screening for SMA is recommended to refine the couple's chance of having a child with SMA. The couple indicated that they would like to consider partner carrier screening at a later date.   We also talked briefly about treatments that exist for SMA. We discussed that several FDA-approved treatments that target the SMN1 and SMN2 genes are now available. Audie Clear is an SMA gene therapy administered via a one-time infusion that replaces the defective or missing SMN1 gene with a working copy. This new gene increases SMN protein levels, which improves motor neuron function and increases the likelihood of survival. Spinraza (AKA nusinersen) and Evrysdi both increase the amount of functional SMN protein produced by the SMN2 gene.   If both members of a couple are known to be carriers, then testing in the pregnancy through amniocentesis is available.  We reviewed the risks, benefits and limitations of this procedure.  Testing the baby after birth through newborn screening or cord blood  testing is also an option if the couple declines carrier testing for the father of the baby at this time.   The results of the [employer] screen also identified Ms. Karpf as a silent carrier for alpha-thalassemia (aa/a-). Alpha-thalassemia is different in its inheritance compared to other hemoglobinopathies as there are two copies of two alpha globin genes (HBA1 and HBA2) on  each chromosome 16, or four alpha globin genes total (aa/aa). A person can be a carrier of one alpha gene mutation (aa/a-), also referred to as a "silent carrier". A person who carries two alpha globin gene mutations can either carry them in cis (both on the same chromosome, denoted as aa/--) or in trans (on different chromosomes, denoted as a-/a-). Alpha-thalassemia carriers of two mutations who have African American ancestry are more likely to have a trans arrangement (a-/a-); cis configuration is reported to be rare in individuals with African American ancestry.     There are several different forms of alpha-thalassemia. The most severe form of alpha-thalassemia, Hb Barts, is associated with an absence of alpha globin chain synthesis as a result of deletions of all four alpha globin genes (--/--).  Given that Ms. Hakimian is a silent carrier (aa/a-), her pregnancies would not be at increased risk for Hb Barts, even if her partner is a carrier for alpha-thalassemia, as she will always pass on at least one copy of the alpha globin gene to her children. Hemoglobin H (HbH) disease is caused by three deleted or dysfunctioning alpha globin alleles (a-/--) and is characterized by microcytic hypochromic hemolytic anemia, hepatosplenomegaly, mild jaundice, growth retardation, and sometimes thalassemia-like bone changes. Given Ms. Constantino' silent carrier status (aa/a-), the current fetus would only be at risk for HbH disease (a-/--), if her partner is a carrier for two alpha globin mutations in cis (aa/--). If this is the case, the risk for HbH disease in the pregnancy would be 1 in 4 (25%). However, if Ms. Whiteside' partner is a carrier for two alpha globin mutations, he would be more likely to carry them in trans configuration (a-/a-) than the cis configuration (aa/--), given his ethnicity. If he is a carrier of alpha-thalassemia in trans, then the pregnancy would not be at increased risk for HbH disease. Based on the  carrier frequency for alpha-thalassemia in the African American population, Ms. Garmany' partner has a 1 in 30 chance of being any type of carrier for alpha-thalassemia. Carrier screening is available for her partner for this condition as well if desired and if he were found to be carrier then testing of the pregnancy could be made available.  Lastly, we reviewed the family history and pregnancy history.  This is the first pregnancy for this patient  She reported no complications or exposures to medications, alcohol, recreational drugs or tobacco.  In the family history, Mr. Jenelle Mages reported a paternal first cousin once removed with sickle cell disease.  Based upon this history, he has a 1 in 8 chance to be a carrier.  We offered carrier testing for him, but it would not be a concern for this pregnancy because Ms. Waitman was negative for changes in the HBB gene which causes sickle cell disease. The remainder of the family history is unremarkable for birth defects, developmental delays, recurrent pregnancy loss or known genetic conditions.  Plan of care:  The couple declined carrier screening for Mr. Jenelle Mages for SMA and alpha thalassemia given Ms. [employer] carrier status.  Declined testing for sickle cell trait due to his family  history. They indicated they would not desire amniocentesis for these conditions.  The took papers regarding testing costs with them for consideration.  Return in 8 weeks for follow up ultrasound and they were encouraged to let us know if they desire testing at that time.  They were given our card and encouraged to call with questions  We may be reached at [phone].  Cherly Anderson, MS, CGC

## 2020-05-31 ENCOUNTER — Ambulatory Visit: Payer: Self-pay

## 2020-06-14 ENCOUNTER — Other Ambulatory Visit: Payer: Self-pay

## 2020-06-14 ENCOUNTER — Encounter: Payer: Self-pay | Admitting: Obstetrics and Gynecology

## 2020-06-14 ENCOUNTER — Other Ambulatory Visit: Payer: Medicaid Other

## 2020-06-14 ENCOUNTER — Ambulatory Visit (INDEPENDENT_AMBULATORY_CARE_PROVIDER_SITE_OTHER): Payer: Medicaid Other | Admitting: Obstetrics and Gynecology

## 2020-06-14 VITALS — BP 110/73 | HR 99 | Wt 198.5 lb

## 2020-06-14 DIAGNOSIS — Z3401 Encounter for supervision of normal first pregnancy, first trimester: Secondary | ICD-10-CM

## 2020-06-14 DIAGNOSIS — Z3A27 27 weeks gestation of pregnancy: Secondary | ICD-10-CM

## 2020-06-14 NOTE — Progress Notes (Signed)
Patient presents for ROB. Patient has no concerns today. Defer TDAP to next vist.  PHQ 2-9 score: 2

## 2020-06-14 NOTE — Progress Notes (Signed)
Subjective:  Danija Gosa is a 20 y.o. G1P0000 at [redacted]w[redacted]d being seen today for ongoing prenatal care.  She is currently monitored for the following issues for this low-risk pregnancy and has Encounter for supervision of normal first pregnancy in first trimester on their problem list.  Patient reports no complaints.  Contractions: Not present. Vag. Bleeding: None.  Movement: Present. Denies leaking of fluid.   The following portions of the patient's history were reviewed and updated as appropriate: allergies, current medications, past family history, past medical history, past social history, past surgical history and problem list. Problem list updated.  Objective:   Vitals:   06/14/20 0845  BP: 110/73  Pulse: 99  Weight: 198 lb 8 oz (90 kg)    Fetal Status:     Movement: Present     General:  Alert, oriented and cooperative. Patient is in no acute distress.  Skin: Skin is warm and dry. No rash noted.   Cardiovascular: Normal heart rate noted  Respiratory: Normal respiratory effort, no problems with respiration noted  Abdomen: Soft, gravid, appropriate for gestational age. Pain/Pressure: Absent     Pelvic:  Cervical exam deferred        Extremities: Normal range of motion.  Edema: None  Mental Status: Normal mood and affect. Normal behavior. Normal judgment and thought content.   Urinalysis:      Assessment and Plan:  Pregnancy: G1P0000 at [redacted]w[redacted]d  1. Encounter for supervision of normal first pregnancy in first trimester Stable - Glucose Tolerance, 2 Hours w/1 Hour - CBC - HIV Antibody (routine testing w rflx) - RPR   Preterm labor symptoms and general obstetric precautions including but not limited to vaginal bleeding, contractions, leaking of fluid and fetal movement were reviewed in detail with the patient. Please refer to After Visit Summary for other counseling recommendations.  Return in about 3 weeks (around 07/05/2020) for OB visit, virtual.   Hermina Staggers,  MD

## 2020-06-14 NOTE — Patient Instructions (Signed)

## 2020-06-17 LAB — CBC
Hematocrit: 30.9 % — ABNORMAL LOW (ref 34.0–46.6)
Hemoglobin: 9.7 g/dL — ABNORMAL LOW (ref 11.1–15.9)
MCH: 25.3 pg — ABNORMAL LOW (ref 26.6–33.0)
MCHC: 31.4 g/dL — ABNORMAL LOW (ref 31.5–35.7)
MCV: 81 fL (ref 79–97)
Platelets: 489 10*3/uL — ABNORMAL HIGH (ref 150–450)
RBC: 3.83 x10E6/uL (ref 3.77–5.28)
RDW: 13.6 % (ref 11.7–15.4)
WBC: 11.1 10*3/uL — ABNORMAL HIGH (ref 3.4–10.8)

## 2020-06-17 LAB — GLUCOSE TOLERANCE, 2 HOURS W/ 1HR
Glucose, 1 hour: 136 mg/dL (ref 65–179)
Glucose, 2 hour: 98 mg/dL (ref 65–152)
Glucose, Fasting: 86 mg/dL (ref 65–91)

## 2020-06-17 LAB — RPR, QUANT+TP ABS (REFLEX)
Rapid Plasma Reagin, Quant: 1:1 {titer} — ABNORMAL HIGH
T Pallidum Abs: NONREACTIVE

## 2020-06-17 LAB — RPR: RPR Ser Ql: REACTIVE — AB

## 2020-06-17 LAB — HIV ANTIBODY (ROUTINE TESTING W REFLEX): HIV Screen 4th Generation wRfx: NONREACTIVE

## 2020-06-21 ENCOUNTER — Other Ambulatory Visit: Payer: Self-pay

## 2020-06-21 DIAGNOSIS — O99013 Anemia complicating pregnancy, third trimester: Secondary | ICD-10-CM

## 2020-06-21 MED ORDER — FERROUS SULFATE 325 (65 FE) MG PO TABS: 325.0000 mg | ORAL_TABLET | ORAL | 4 refills | Status: AC

## 2020-06-28 ENCOUNTER — Telehealth (INDEPENDENT_AMBULATORY_CARE_PROVIDER_SITE_OTHER): Payer: Medicaid Other | Admitting: Obstetrics and Gynecology

## 2020-06-28 ENCOUNTER — Encounter: Payer: Self-pay | Admitting: Obstetrics and Gynecology

## 2020-06-28 DIAGNOSIS — Z3401 Encounter for supervision of normal first pregnancy, first trimester: Secondary | ICD-10-CM

## 2020-06-28 DIAGNOSIS — R768 Other specified abnormal immunological findings in serum: Secondary | ICD-10-CM

## 2020-06-28 DIAGNOSIS — O99891 Other specified diseases and conditions complicating pregnancy: Secondary | ICD-10-CM

## 2020-06-28 DIAGNOSIS — Z3A29 29 weeks gestation of pregnancy: Secondary | ICD-10-CM

## 2020-06-28 MED ORDER — FAMOTIDINE 20 MG PO TABS: 20.0000 mg | ORAL_TABLET | Freq: Every day | ORAL | 5 refills | Status: DC

## 2020-06-28 NOTE — Progress Notes (Signed)
   OBSTETRICS PRENATAL VIRTUAL VISIT ENCOUNTER NOTE  Provider location: [facility]   Patient location: Home  I connected with Tricia Morrison on 21/21/22 at  2:30 PM EDT by MyChart Video Encounter and verified that I am speaking with the correct person using two identifiers. I discussed the limitations, risks, security and privacy concerns of performing an evaluation and management service virtually and the availability of in person appointments. I also discussed with the patient that there may be a patient responsible charge related to this service. The patient expressed understanding and agreed to proceed. Subjective:  Tricia Morrison is a 20 y.o. G1P0000 at [redacted]w[redacted]d being seen today for ongoing prenatal care.  She is currently monitored for the following issues for this low-risk pregnancy and has Encounter for supervision of normal first pregnancy in first trimester and Biological false positive RPR test on their problem list.  Patient reports heartburn.  Contractions: Not present. Vag. Bleeding: None.  Movement: Present. Denies any leaking of fluid.   The following portions of the patient's history were reviewed and updated as appropriate: allergies, current medications, past family history, past medical history, past social history, past surgical history and problem list.   Objective:  There were no vitals filed for this visit.  Fetal Status:     Movement: Present     General:  Alert, oriented and cooperative. Patient is in no acute distress.  Respiratory: Normal respiratory effort, no problems with respiration noted  Mental Status: Normal mood and affect. Normal behavior. Normal judgment and thought content.  Rest of physical exam deferred due to type of encounter  Imaging: No results found.  Assessment and Plan:  Pregnancy: G1P0000 at [redacted]w[redacted]d 1. Encounter for supervision of normal first pregnancy in first trimester Stable - famotidine (PEPCID) 20  MG tablet; Take 1 tablet (20 mg total) by mouth daily.  Dispense: 30 tablet; Refill: 5  2. Biological false positive RPR test T.P negative on last check 1:1 titer last check as well  Preterm labor symptoms and general obstetric precautions including but not limited to vaginal bleeding, contractions, leaking of fluid and fetal movement were reviewed in detail with the patient. I discussed the assessment and treatment plan with the patient. The patient was provided an opportunity to ask questions and all were answered. The patient agreed with the plan and demonstrated an understanding of the instructions. The patient was advised to call back or seek an in-person office evaluation/go to [facility] at [facility] for any urgent or concerning symptoms. Please refer to After Visit Summary for other counseling recommendations.   I provided 8 minutes of face-to-face time during this encounter.  Return in about 2 weeks (around 07/12/2020) for OB visit, virtual.  Future Appointments  Date Time Provider Department Center  07/19/2020  1:30 PM [facility] NURSE [facility]  07/19/2020  1:45 PM [facility]    Hermina Staggers, MD [facility]

## 2020-06-28 NOTE — Progress Notes (Signed)
I connected with  Tricia Morrison on 06/28/20 by a video enabled telemedicine application and verified that I am speaking with the correct person using two identifiers.   I discussed the limitations of evaluation and management by telemedicine. The patient expressed understanding and agreed to proceed.   PATIENT:     HOME PROVIDER: [facility]  MyChart OB, reports no problems today.  She is unable to check her BP because she is unable to locate the BP cuff right now.

## 2020-07-19 ENCOUNTER — Encounter: Payer: Self-pay | Admitting: *Deleted

## 2020-07-19 ENCOUNTER — Ambulatory Visit: Payer: Medicaid Other | Admitting: *Deleted

## 2020-07-19 ENCOUNTER — Other Ambulatory Visit: Payer: Self-pay | Admitting: *Deleted

## 2020-07-19 ENCOUNTER — Ambulatory Visit: Payer: Medicaid Other | Attending: Obstetrics and Gynecology

## 2020-07-19 ENCOUNTER — Other Ambulatory Visit: Payer: Self-pay

## 2020-07-19 VITALS — BP 112/59 | HR 97

## 2020-07-19 DIAGNOSIS — O3663X0 Maternal care for excessive fetal growth, third trimester, not applicable or unspecified: Secondary | ICD-10-CM | POA: Diagnosis present

## 2020-07-19 DIAGNOSIS — Z148 Genetic carrier of other disease: Secondary | ICD-10-CM

## 2020-07-19 DIAGNOSIS — E669 Obesity, unspecified: Secondary | ICD-10-CM | POA: Diagnosis not present

## 2020-07-19 DIAGNOSIS — Z362 Encounter for other antenatal screening follow-up: Secondary | ICD-10-CM

## 2020-07-19 DIAGNOSIS — O99213 Obesity complicating pregnancy, third trimester: Secondary | ICD-10-CM

## 2020-07-19 DIAGNOSIS — O99013 Anemia complicating pregnancy, third trimester: Secondary | ICD-10-CM

## 2020-07-19 DIAGNOSIS — O09523 Supervision of elderly multigravida, third trimester: Secondary | ICD-10-CM | POA: Diagnosis not present

## 2020-07-19 DIAGNOSIS — Z3A32 32 weeks gestation of pregnancy: Secondary | ICD-10-CM

## 2020-07-19 DIAGNOSIS — D649 Anemia, unspecified: Secondary | ICD-10-CM

## 2020-08-01 ENCOUNTER — Encounter: Payer: Self-pay | Admitting: Obstetrics

## 2020-08-01 ENCOUNTER — Other Ambulatory Visit: Payer: Self-pay

## 2020-08-01 ENCOUNTER — Ambulatory Visit (INDEPENDENT_AMBULATORY_CARE_PROVIDER_SITE_OTHER): Payer: Medicaid Other | Admitting: Obstetrics

## 2020-08-01 VITALS — BP 127/71 | HR 101 | Wt 190.0 lb

## 2020-08-01 DIAGNOSIS — K219 Gastro-esophageal reflux disease without esophagitis: Secondary | ICD-10-CM

## 2020-08-01 DIAGNOSIS — Z3480 Encounter for supervision of other normal pregnancy, unspecified trimester: Secondary | ICD-10-CM

## 2020-08-01 MED ORDER — PANTOPRAZOLE SODIUM 40 MG PO TBEC: 40.0000 mg | DELAYED_RELEASE_TABLET | Freq: Two times a day (BID) | ORAL | 3 refills | Status: DC

## 2020-08-01 NOTE — Progress Notes (Signed)
Patient presents for ROB. 

## 2020-08-01 NOTE — Progress Notes (Signed)
Subjective:  Tricia Morrison is a 20 y.o. G1P0000 at [redacted]w[redacted]d being seen today for ongoing prenatal care.  She is currently monitored for the following issues for this low-risk pregnancy and has Encounter for supervision of normal first pregnancy in first trimester and Biological false positive RPR test on their problem list.  Patient reports heartburn.  Contractions: Not present. Vag. Bleeding: None.  Movement: Present. Denies leaking of fluid.   The following portions of the patient's history were reviewed and updated as appropriate: allergies, current medications, past family history, past medical history, past social history, past surgical history and problem list. Problem list updated.  Objective:   Vitals:   08/01/20 1516  BP: 127/71  Pulse: (!) 101  Weight: 190 lb (86.2 kg)    Fetal Status:     Movement: Present     General:  Alert, oriented and cooperative. Patient is in no acute distress.  Skin: Skin is warm and dry. No rash noted.   Cardiovascular: Normal heart rate noted  Respiratory: Normal respiratory effort, no problems with respiration noted  Abdomen: Soft, gravid, appropriate for gestational age. Pain/Pressure: Absent     Pelvic:  Cervical exam deferred        Extremities: Normal range of motion.  Edema: None  Mental Status: Normal mood and affect. Normal behavior. Normal judgment and thought content.   Urinalysis:      Assessment and Plan:  Pregnancy: G1P0000 at [redacted]w[redacted]d  1. Supervision of other normal pregnancy, antepartum  2. Gastroesophageal reflux disease without esophagitis Rx: - pantoprazole (PROTONIX) 40 MG tablet; Take 1 tablet (40 mg total) by mouth 2 (two) times daily before a meal.  Dispense: 60 tablet; Refill: 3  Preterm labor symptoms and general obstetric precautions including but not limited to vaginal bleeding, contractions, leaking of fluid and fetal movement were reviewed in detail with the patient. Please refer to After Visit Summary for other  counseling recommendations.   Return in about 2 weeks (around 08/15/2020) for ROB.   Brock Bad, MD  08/01/20

## 2020-08-15 ENCOUNTER — Ambulatory Visit: Payer: Medicaid Other | Attending: Maternal & Fetal Medicine

## 2020-08-15 ENCOUNTER — Ambulatory Visit (INDEPENDENT_AMBULATORY_CARE_PROVIDER_SITE_OTHER): Payer: Medicaid Other | Admitting: Obstetrics

## 2020-08-15 ENCOUNTER — Other Ambulatory Visit: Payer: Self-pay

## 2020-08-15 ENCOUNTER — Ambulatory Visit: Payer: Medicaid Other | Admitting: *Deleted

## 2020-08-15 ENCOUNTER — Encounter: Payer: Self-pay | Admitting: Obstetrics

## 2020-08-15 ENCOUNTER — Encounter: Payer: Self-pay | Admitting: *Deleted

## 2020-08-15 ENCOUNTER — Other Ambulatory Visit (HOSPITAL_COMMUNITY)
Admission: RE | Admit: 2020-08-15 | Discharge: 2020-08-15 | Disposition: A | Discharge status: Home / Self Care | Payer: Medicaid Other | Source: Ambulatory Visit | Attending: Obstetrics | Admitting: Obstetrics

## 2020-08-15 VITALS — BP 130/69 | HR 88

## 2020-08-15 VITALS — BP 124/80 | HR 92 | Wt 198.4 lb

## 2020-08-15 DIAGNOSIS — Z148 Genetic carrier of other disease: Secondary | ICD-10-CM

## 2020-08-15 DIAGNOSIS — Z362 Encounter for other antenatal screening follow-up: Secondary | ICD-10-CM | POA: Diagnosis not present

## 2020-08-15 DIAGNOSIS — Z6838 Body mass index (BMI) 38.0-38.9, adult: Secondary | ICD-10-CM | POA: Diagnosis present

## 2020-08-15 DIAGNOSIS — Z3401 Encounter for supervision of normal first pregnancy, first trimester: Secondary | ICD-10-CM | POA: Diagnosis present

## 2020-08-15 DIAGNOSIS — E669 Obesity, unspecified: Secondary | ICD-10-CM

## 2020-08-15 DIAGNOSIS — O99013 Anemia complicating pregnancy, third trimester: Secondary | ICD-10-CM

## 2020-08-15 DIAGNOSIS — O99019 Anemia complicating pregnancy, unspecified trimester: Secondary | ICD-10-CM

## 2020-08-15 DIAGNOSIS — O3663X0 Maternal care for excessive fetal growth, third trimester, not applicable or unspecified: Secondary | ICD-10-CM | POA: Diagnosis present

## 2020-08-15 DIAGNOSIS — D649 Anemia, unspecified: Secondary | ICD-10-CM

## 2020-08-15 DIAGNOSIS — O99213 Obesity complicating pregnancy, third trimester: Secondary | ICD-10-CM

## 2020-08-15 DIAGNOSIS — Z3A36 36 weeks gestation of pregnancy: Secondary | ICD-10-CM

## 2020-08-15 MED ORDER — VITAFOL ULTRA 29-0.6-0.4-200 MG PO CAPS: 1.0000 | ORAL_CAPSULE | Freq: Every day | ORAL | 4 refills | Status: DC

## 2020-08-15 MED ORDER — FERROUS SULFATE 325 (65 FE) MG PO TABS: 325.0000 mg | ORAL_TABLET | ORAL | 5 refills | Status: DC

## 2020-08-15 NOTE — Progress Notes (Signed)
Subjective:  Tricia Morrison is a 20 y.o. G1P0000 at [redacted]w[redacted]d being seen today for ongoing prenatal care.  She is currently monitored for the following issues for this low-risk pregnancy and has Encounter for supervision of normal first pregnancy in first trimester and Biological false positive RPR test on their problem list.  Patient reports  craving starch .  Contractions: Not present. Vag. Bleeding: None.  Movement: Present. Denies leaking of fluid.   The following portions of the patient's history were reviewed and updated as appropriate: allergies, current medications, past family history, past medical history, past social history, past surgical history and problem list. Problem list updated.  Objective:   Vitals:   08/15/20 1341  BP: 124/80  Pulse: 92  Weight: 198 lb 6.4 oz (90 kg)    Fetal Status:     Movement: Present     General:  Alert, oriented and cooperative. Patient is in no acute distress.  Skin: Skin is warm and dry. No rash noted.   Cardiovascular: Normal heart rate noted  Respiratory: Normal respiratory effort, no problems with respiration noted  Abdomen: Soft, gravid, appropriate for gestational age. Pain/Pressure: Absent     Pelvic:  Cervical exam deferred        Extremities: Normal range of motion.  Edema: None  Mental Status: Normal mood and affect. Normal behavior. Normal judgment and thought content.   Urinalysis:      Assessment and Plan:  Pregnancy: G1P0000 at [redacted]w[redacted]d  1. Encounter for supervision of normal first pregnancy in first trimester Rx: - Strep Gp B NAA - Cervicovaginal ancillary only( [place]) - Prenat-Fe Poly-Methfol-FA-DHA (VITAFOL ULTRA) 29-0.6-0.4-200 MG CAPS; Take 1 capsule by mouth daily before breakfast.  Dispense: 90 capsule; Refill: 4  2. Anemia affecting pregnancy, antepartum Rx: - ferrous sulfate 325 (65 FE) MG tablet; Take 1 tablet (325 mg total) by mouth every other day.  Dispense: 60 tablet; Refill: 5  Preterm labor  symptoms and general obstetric precautions including but not limited to vaginal bleeding, contractions, leaking of fluid and fetal movement were reviewed in detail with the patient. Please refer to After Visit Summary for other counseling recommendations.   Return in about 1 week (around 08/22/2020) for ROB.   Brock Bad, MD  08/15/20

## 2020-08-15 NOTE — Progress Notes (Signed)
Patient presents for ROB and GBS. Patient states that she has been craving corn starch. No other concerns.

## 2020-08-16 LAB — CERVICOVAGINAL ANCILLARY ONLY
Bacterial Vaginitis (gardnerella): POSITIVE — AB
Candida Glabrata: NEGATIVE
Candida Vaginitis: POSITIVE — AB
Chlamydia: NEGATIVE
Comment: NEGATIVE
Comment: NEGATIVE
Comment: NEGATIVE
Comment: NEGATIVE
Comment: NEGATIVE
Comment: NORMAL
Neisseria Gonorrhea: NEGATIVE
Trichomonas: NEGATIVE

## 2020-08-17 ENCOUNTER — Other Ambulatory Visit: Payer: Self-pay | Admitting: Obstetrics

## 2020-08-17 DIAGNOSIS — B3731 Acute candidiasis of vulva and vagina: Secondary | ICD-10-CM

## 2020-08-17 DIAGNOSIS — B9689 Other specified bacterial agents as the cause of diseases classified elsewhere: Secondary | ICD-10-CM

## 2020-08-17 DIAGNOSIS — B373 Candidiasis of vulva and vagina: Secondary | ICD-10-CM

## 2020-08-17 DIAGNOSIS — N760 Acute vaginitis: Secondary | ICD-10-CM

## 2020-08-17 LAB — STREP GP B NAA: Strep Gp B NAA: POSITIVE — AB

## 2020-08-17 MED ORDER — TERCONAZOLE 0.8 % VA CREA: 1.0000 | TOPICAL_CREAM | Freq: Every day | VAGINAL | 0 refills | Status: DC

## 2020-08-17 MED ORDER — FLUCONAZOLE 150 MG PO TABS: 150.0000 mg | ORAL_TABLET | Freq: Once | ORAL | 0 refills | Status: DC

## 2020-08-17 MED ORDER — METRONIDAZOLE 500 MG PO TABS: 500.0000 mg | ORAL_TABLET | Freq: Two times a day (BID) | ORAL | 2 refills | Status: DC

## 2020-08-22 ENCOUNTER — Encounter: Payer: Self-pay | Admitting: Obstetrics

## 2020-08-22 ENCOUNTER — Ambulatory Visit (INDEPENDENT_AMBULATORY_CARE_PROVIDER_SITE_OTHER): Payer: Medicaid Other | Admitting: Obstetrics

## 2020-08-22 ENCOUNTER — Other Ambulatory Visit: Payer: Self-pay

## 2020-08-22 VITALS — BP 132/77 | HR 94 | Wt 195.7 lb

## 2020-08-22 DIAGNOSIS — Z3480 Encounter for supervision of other normal pregnancy, unspecified trimester: Secondary | ICD-10-CM

## 2020-08-22 DIAGNOSIS — O99019 Anemia complicating pregnancy, unspecified trimester: Secondary | ICD-10-CM

## 2020-08-22 NOTE — Progress Notes (Signed)
Subjective:  Tricia Morrison is a 20 y.o. G1P0000 at [redacted]w[redacted]d being seen today for ongoing prenatal care.  She is currently monitored for the following issues for this low-risk pregnancy and has Encounter for supervision of normal first pregnancy in first trimester and Biological false positive RPR test on their problem list.  Patient reports backache.  Contractions: Irritability. Vag. Bleeding: None.  Movement: Present. Denies leaking of fluid.   The following portions of the patient's history were reviewed and updated as appropriate: allergies, current medications, past family history, past medical history, past social history, past surgical history and problem list. Problem list updated.  Objective:   Vitals:   08/22/20 1534  BP: 132/77  Pulse: 94  Weight: 195 lb 11.2 oz (88.8 kg)    Fetal Status:     Movement: Present     General:  Alert, oriented and cooperative. Patient is in no acute distress.  Skin: Skin is warm and dry. No rash noted.   Cardiovascular: Normal heart rate noted  Respiratory: Normal respiratory effort, no problems with respiration noted  Abdomen: Soft, gravid, appropriate for gestational age. Pain/Pressure: Present     Pelvic:  Cervical exam deferred        Extremities: Normal range of motion.     Mental Status: Normal mood and affect. Normal behavior. Normal judgment and thought content.   Urinalysis:      Assessment and Plan:  Pregnancy: G1P0000 at [redacted]w[redacted]d  1. Supervision of other normal pregnancy, antepartum  2. Anemia affecting pregnancy, antepartum - taking iron   Term labor symptoms and general obstetric precautions including but not limited to vaginal bleeding, contractions, leaking of fluid and fetal movement were reviewed in detail with the patient. Please refer to After Visit Summary for other counseling recommendations.   Return in about 1 week (around 08/29/2020) for ROB.   Brock Bad, MD  08/22/20

## 2020-08-29 ENCOUNTER — Encounter: Payer: Medicaid Other | Admitting: Obstetrics

## 2020-08-30 ENCOUNTER — Other Ambulatory Visit: Payer: Self-pay

## 2020-08-30 ENCOUNTER — Encounter: Payer: Self-pay | Admitting: Obstetrics

## 2020-08-30 ENCOUNTER — Ambulatory Visit (INDEPENDENT_AMBULATORY_CARE_PROVIDER_SITE_OTHER): Payer: Medicaid Other | Admitting: Obstetrics

## 2020-08-30 VITALS — BP 129/77 | HR 94 | Wt 193.3 lb

## 2020-08-30 DIAGNOSIS — K219 Gastro-esophageal reflux disease without esophagitis: Secondary | ICD-10-CM

## 2020-08-30 DIAGNOSIS — Z3480 Encounter for supervision of other normal pregnancy, unspecified trimester: Secondary | ICD-10-CM

## 2020-08-30 DIAGNOSIS — O99019 Anemia complicating pregnancy, unspecified trimester: Secondary | ICD-10-CM

## 2020-08-30 NOTE — Progress Notes (Signed)
ROB 38.3wks No complaints.

## 2020-08-30 NOTE — Progress Notes (Signed)
Subjective:  Tricia Morrison is a 20 y.o. G1P0000 at [redacted]w[redacted]d being seen today for ongoing prenatal care.  She is currently monitored for the following issues for this low-risk pregnancy and has Encounter for supervision of normal first pregnancy in first trimester and Biological false positive RPR test on their problem list.  Patient reports no complaints.  Contractions: Irritability. Vag. Bleeding: None.  Movement: Present. Denies leaking of fluid.   The following portions of the patient's history were reviewed and updated as appropriate: allergies, current medications, past family history, past medical history, past social history, past surgical history and problem list. Problem list updated.  Objective:   Vitals:   08/30/20 1550  BP: 129/77  Pulse: 94  Weight: 193 lb 4.8 oz (87.7 kg)    Fetal Status: Fetal Heart Rate (bpm): 161   Movement: Present     General:  Alert, oriented and cooperative. Patient is in no acute distress.  Skin: Skin is warm and dry. No rash noted.   Cardiovascular: Normal heart rate noted  Respiratory: Normal respiratory effort, no problems with respiration noted  Abdomen: Soft, gravid, appropriate for gestational age. Pain/Pressure: Present     Pelvic:  Cervical exam deferred        Extremities: Normal range of motion.  Edema: None  Mental Status: Normal mood and affect. Normal behavior. Normal judgment and thought content.   Urinalysis:      Assessment and Plan:  Pregnancy: G1P0000 at [redacted]w[redacted]d  1. Supervision of other normal pregnancy, antepartum  2. Anemia affecting pregnancy, antepartum - taking iron  3. Gastroesophageal reflux disease without esophagitis - taking Protonix, much improved  Term labor symptoms and general obstetric precautions including but not limited to vaginal bleeding, contractions, leaking of fluid and fetal movement were reviewed in detail with the patient. Please refer to After Visit Summary for other counseling  recommendations.   Return in about 1 week (around 09/06/2020) for ROB.   Brock Bad, MD  08/30/20

## 2020-09-06 ENCOUNTER — Other Ambulatory Visit: Payer: Self-pay

## 2020-09-06 ENCOUNTER — Ambulatory Visit (INDEPENDENT_AMBULATORY_CARE_PROVIDER_SITE_OTHER): Payer: Medicaid Other | Admitting: Obstetrics

## 2020-09-06 ENCOUNTER — Encounter: Payer: Self-pay | Admitting: Obstetrics

## 2020-09-06 VITALS — BP 129/86 | HR 114 | Wt 192.0 lb

## 2020-09-06 DIAGNOSIS — O99019 Anemia complicating pregnancy, unspecified trimester: Secondary | ICD-10-CM

## 2020-09-06 DIAGNOSIS — Z3480 Encounter for supervision of other normal pregnancy, unspecified trimester: Secondary | ICD-10-CM

## 2020-09-06 NOTE — Progress Notes (Signed)
Subjective:  Tricia Morrison is a 20 y.o. G1P0000 at [redacted]w[redacted]d being seen today for ongoing prenatal care.  She is currently monitored for the following issues for this low-risk pregnancy and has Encounter for supervision of normal first pregnancy in first trimester and Biological false positive RPR test on their problem list.  Patient reports no complaints.  Contractions: Irritability. Vag. Bleeding: Scant.  Movement: Present. Denies leaking of fluid.   The following portions of the patient's history were reviewed and updated as appropriate: allergies, current medications, past family history, past medical history, past social history, past surgical history and problem list. Problem list updated.  Objective:   Vitals:   09/06/20 1503  BP: 129/86  Pulse: (!) 114  Weight: 192 lb (87.1 kg)    Fetal Status:     Movement: Present     General:  Alert, oriented and cooperative. Patient is in no acute distress.  Skin: Skin is warm and dry. No rash noted.   Cardiovascular: Normal heart rate noted  Respiratory: Normal respiratory effort, no problems with respiration noted  Abdomen: Soft, gravid, appropriate for gestational age. Pain/Pressure: Present     Pelvic:  Cervical exam performed      2cm / 50% / -3 / Vtx  Extremities: Normal range of motion.  Edema: None  Mental Status: Normal mood and affect. Normal behavior. Normal judgment and thought content.   Urinalysis:      Assessment and Plan:  Pregnancy: G1P0000 at [redacted]w[redacted]d  1. Supervision of other normal pregnancy, antepartum  2. Anemia affecting pregnancy, antepartum - taking iron    There are no diagnoses linked to this encounter. Term labor symptoms and general obstetric precautions including but not limited to vaginal bleeding, contractions, leaking of fluid and fetal movement were reviewed in detail with the patient. Please refer to After Visit Summary for other counseling recommendations.   Return in about 1 week (around  09/13/2020) for ROB.   Brock Bad, MD  09/07/20

## 2020-09-06 NOTE — Progress Notes (Signed)
+   Fetal movement. Pt states she is having some spotting.

## 2020-09-07 ENCOUNTER — Encounter (HOSPITAL_COMMUNITY): Payer: Self-pay | Admitting: Obstetrics & Gynecology

## 2020-09-07 ENCOUNTER — Inpatient Hospital Stay (HOSPITAL_COMMUNITY)
Admission: AD | Admit: 2020-09-07 | Discharge: 2020-09-07 | Disposition: A | Discharge status: Home / Self Care | Payer: Medicaid Other | Source: Home / Self Care | Attending: Obstetrics & Gynecology | Admitting: Obstetrics & Gynecology

## 2020-09-07 ENCOUNTER — Other Ambulatory Visit: Payer: Self-pay

## 2020-09-07 ENCOUNTER — Inpatient Hospital Stay (HOSPITAL_COMMUNITY)
Admission: AD | Admit: 2020-09-07 | Discharge: 2020-09-10 | DRG: 805 | Disposition: A | Discharge status: Home / Self Care | Payer: Medicaid Other | Attending: Family Medicine | Admitting: Family Medicine

## 2020-09-07 DIAGNOSIS — D62 Acute posthemorrhagic anemia: Secondary | ICD-10-CM | POA: Diagnosis not present

## 2020-09-07 DIAGNOSIS — O471 False labor at or after 37 completed weeks of gestation: Secondary | ICD-10-CM | POA: Insufficient documentation

## 2020-09-07 DIAGNOSIS — Z3A39 39 weeks gestation of pregnancy: Secondary | ICD-10-CM | POA: Insufficient documentation

## 2020-09-07 DIAGNOSIS — O134 Gestational [pregnancy-induced] hypertension without significant proteinuria, complicating childbirth: Secondary | ICD-10-CM | POA: Diagnosis present

## 2020-09-07 DIAGNOSIS — N898 Other specified noninflammatory disorders of vagina: Secondary | ICD-10-CM

## 2020-09-07 DIAGNOSIS — O99824 Streptococcus B carrier state complicating childbirth: Secondary | ICD-10-CM | POA: Diagnosis present

## 2020-09-07 DIAGNOSIS — O9081 Anemia of the puerperium: Secondary | ICD-10-CM | POA: Diagnosis not present

## 2020-09-07 DIAGNOSIS — Z30017 Encounter for initial prescription of implantable subdermal contraceptive: Secondary | ICD-10-CM

## 2020-09-07 DIAGNOSIS — Z20822 Contact with and (suspected) exposure to covid-19: Secondary | ICD-10-CM | POA: Diagnosis present

## 2020-09-07 DIAGNOSIS — O411230 Chorioamnionitis, third trimester, not applicable or unspecified: Secondary | ICD-10-CM | POA: Diagnosis present

## 2020-09-07 DIAGNOSIS — O139 Gestational [pregnancy-induced] hypertension without significant proteinuria, unspecified trimester: Secondary | ICD-10-CM

## 2020-09-07 DIAGNOSIS — R7989 Other specified abnormal findings of blood chemistry: Secondary | ICD-10-CM

## 2020-09-07 DIAGNOSIS — Z3480 Encounter for supervision of other normal pregnancy, unspecified trimester: Secondary | ICD-10-CM

## 2020-09-07 LAB — COMPREHENSIVE METABOLIC PANEL
ALT: 13 U/L (ref 0–44)
AST: 14 U/L — ABNORMAL LOW (ref 15–41)
Albumin: 2.5 g/dL — ABNORMAL LOW (ref 3.5–5.0)
Alkaline Phosphatase: 94 U/L (ref 38–126)
Anion gap: 6 (ref 5–15)
BUN: <5 mg/dL — ABNORMAL LOW (ref 6–20)
CO2: 22 mmol/L (ref 22–32)
Calcium: 8.8 mg/dL — ABNORMAL LOW (ref 8.9–10.3)
Chloride: 107 mmol/L (ref 98–111)
Creatinine, Ser: 0.51 mg/dL (ref 0.44–1.00)
GFR, Estimated: >60 mL/min (ref >60)
Glucose, Bld: 87 mg/dL (ref 70–99)
Potassium: 3.3 mmol/L — ABNORMAL LOW (ref 3.5–5.1)
Sodium: 135 mmol/L (ref 135–145)
Total Bilirubin: 0.8 mg/dL (ref 0.3–1.2)
Total Protein: 6.1 g/dL — ABNORMAL LOW (ref 6.5–8.1)

## 2020-09-07 LAB — CBC
HCT: 26.7 % — ABNORMAL LOW (ref 36.0–46.0)
Hemoglobin: 8.2 g/dL — ABNORMAL LOW (ref 12.0–15.0)
MCH: 21.6 pg — ABNORMAL LOW (ref 26.0–34.0)
MCHC: 30.7 g/dL (ref 30.0–36.0)
MCV: 70.3 fL — ABNORMAL LOW (ref 80.0–100.0)
Platelets: 481 10*3/uL — ABNORMAL HIGH (ref 150–400)
RBC: 3.8 MIL/uL — ABNORMAL LOW (ref 3.87–5.11)
RDW: 17.5 % — ABNORMAL HIGH (ref 11.5–15.5)
WBC: 13.2 10*3/uL — ABNORMAL HIGH (ref 4.0–10.5)
nRBC: 0.4 % — ABNORMAL HIGH (ref 0.0–0.2)

## 2020-09-07 LAB — POCT FERN TEST: POCT Fern Test: NEGATIVE

## 2020-09-07 LAB — WET PREP, GENITAL
Clue Cells Wet Prep HPF POC: NONE SEEN
Sperm: NONE SEEN
Trich, Wet Prep: NONE SEEN
Yeast Wet Prep HPF POC: NONE SEEN

## 2020-09-07 MED ORDER — SODIUM CHLORIDE 0.9 % IV SOLN
5.0000 10*6.[IU] | Freq: Once | INTRAVENOUS | Status: AC
  Administered 2020-09-08: 5 10*6.[IU] via INTRAVENOUS
  Filled 2020-09-07: qty 5

## 2020-09-07 MED ORDER — SOD CITRATE-CITRIC ACID 500-334 MG/5ML PO SOLN: 30.0000 mL | ORAL | Status: DC | PRN

## 2020-09-07 MED ORDER — ONDANSETRON HCL 4 MG/2ML IJ SOLN
4.0000 mg | Freq: Four times a day (QID) | INTRAMUSCULAR | Status: DC | PRN
  Administered 2020-09-08: 4 mg via INTRAVENOUS
  Filled 2020-09-07: qty 2

## 2020-09-07 MED ORDER — OXYTOCIN-SODIUM CHLORIDE 30-0.9 UT/500ML-% IV SOLN: 2.5000 [IU]/h | INTRAVENOUS | Status: DC

## 2020-09-07 MED ORDER — PENICILLIN G POT IN DEXTROSE 60000 UNIT/ML IV SOLN
3.0000 10*6.[IU] | INTRAVENOUS | Status: DC
  Administered 2020-09-08: 3 10*6.[IU] via INTRAVENOUS
  Filled 2020-09-07: qty 50

## 2020-09-07 MED ORDER — LACTATED RINGERS IV SOLN: INTRAVENOUS | Status: DC

## 2020-09-07 MED ORDER — LACTATED RINGERS IV SOLN
500.0000 mL | INTRAVENOUS | Status: DC | PRN
  Administered 2020-09-08: 500 mL via INTRAVENOUS

## 2020-09-07 MED ORDER — ACETAMINOPHEN 325 MG PO TABS: 650.0000 mg | ORAL_TABLET | ORAL | Status: DC | PRN

## 2020-09-07 MED ORDER — LIDOCAINE HCL (PF) 1 % IJ SOLN: 30.0000 mL | INTRAMUSCULAR | Status: DC | PRN

## 2020-09-07 MED ORDER — OXYCODONE-ACETAMINOPHEN 5-325 MG PO TABS: 2.0000 | ORAL_TABLET | ORAL | Status: DC | PRN

## 2020-09-07 MED ORDER — OXYCODONE-ACETAMINOPHEN 5-325 MG PO TABS: 1.0000 | ORAL_TABLET | ORAL | Status: DC | PRN

## 2020-09-07 MED ORDER — OXYTOCIN BOLUS FROM INFUSION
333.0000 mL | Freq: Once | INTRAVENOUS | Status: AC
  Administered 2020-09-08: 333 mL via INTRAVENOUS

## 2020-09-07 NOTE — MAU Note (Signed)
.  I have communicated with Luna Kitchens, CNM and reviewed vital signs:  Vitals:   09/07/20 1250 09/07/20 1438  BP: 130/65 127/72  Pulse: 89 85  Resp:  17  Temp:    SpO2: 98% 100%    Vaginal exam:  Dilation: 1.5 Effacement (%): 80 Cervical Position: Posterior Station: -3 Presentation: Vertex Exam by:: Erle Crocker, RN,   Also reviewed contraction pattern and that non-stress test is reactive.  It has been documented that patient is contracting every 2-5 minutes with no cervical change over 1.5 hours not indicating active labor.  Patient denies any other complaints.  Based on this report provider has given order for discharge.  A discharge order and diagnosis entered by a provider.   Labor discharge instructions reviewed with patient. Patient agreeable to plan and would like to go home to labor more.

## 2020-09-07 NOTE — MAU Note (Signed)
.  Tricia Morrison is a 20 y.o. at [redacted]w[redacted]d here in MAU reporting: contractions every 8-9 mins, No LOF, no abnormal bleeding or discharge. +FM Onset of complaint: 09/07/20 Pain score: 9/10 Vitals:   09/07/20 2042  BP: (!) 132/94  Pulse: 97  Resp: 20  SpO2: 100%     FHT: 135 Lab orders placed from triage:

## 2020-09-07 NOTE — MAU Provider Note (Signed)
   S: Ms. Tricia Morrison is a 20 y.o. G1P0000 at [redacted]w[redacted]d  who presents to MAU today complaining of leaking of fluid since 0730. She denies vaginal bleeding. She endorses contractions. She reports normal fetal movement.    O: BP 127/72 (BP Location: Right Arm)   Pulse 85   Temp 97.9 F (36.6 C) (Oral)   Resp 17   Ht 5\' 1"  (1.549 m)   Wt 87.7 kg   LMP 11/23/2019   SpO2 100%   BMI 36.54 kg/m  GENERAL: Well-developed, well-nourished female in no acute distress.  HEAD: Normocephalic, atraumatic.  CHEST: Normal effort of breathing, regular heart rate ABDOMEN: Soft, nontender, gravid PELVIC: Normal external female genitalia. Vagina is pink and rugated. Cervix with normal contour, no lesions. Normal discharge.  Neg pooling.   Cervical exam:  Dilation: 1.5 Effacement (%): 80 Cervical Position: Posterior Station: -3 Presentation: Vertex Exam by:: [email or web address], RN   Fetal Monitoring: Baseline: 140 Variability: mod Accelerations: present Decelerations: absent Contractions: q2-5 min  Results for orders placed or performed during the hospital encounter of 09/07/20 (from the past 24 hour(s))  Wet prep, genital     Status: Abnormal   Collection Time: 09/07/20  1:46 PM   Specimen: Cervix  Result Value Ref Range   Yeast Wet Prep HPF POC NONE SEEN NONE SEEN   Trich, Wet Prep NONE SEEN NONE SEEN   Clue Cells Wet Prep HPF POC NONE SEEN NONE SEEN   WBC, Wet Prep HPF POC MANY (A) NONE SEEN   Sperm NONE SEEN   POCT fern test     Status: None   Collection Time: 09/07/20  2:28 PM  Result Value Ref Range   POCT Fern Test Negative = intact amniotic membranes      A: [facility]UP at [redacted]w[redacted]d  Membranes intact  P: Stable for discharge; no ROM NST reactive Keep appt on 09/13/2020  11/13/2020, CNM 09/07/2020 2:44 PM

## 2020-09-07 NOTE — MAU Note (Signed)
Presents stating have ctxs that are 3 minutes apart, states ctxs began @ 0730 this morning. Denies VB, but reports had LOF that began last night.  States fluid is clear.  Endorses +FM.

## 2020-09-07 NOTE — H&P (Signed)
OBSTETRIC ADMISSION HISTORY AND PHYSICAL  Charly Hunton is a 20 y.o. female G1P0000 with IUP at 60w4dby early [place] and found to have new diagnosis of gHTN. She reports +FMs, No LOF, no VB, no blurry vision, headaches or peripheral edema, and RUQ pain.  She plans on breast feeding. She request inpatient PP Nexplanon for birth control. [place]  Estimated Date of Delivery: 09/10/20  Sono:    _0 , CWD, normal anatomy, cephalic presentation, posterior placental lie, 3105g, 74% EFW   Prenatal History/Complications:  GBS positive  Past Medical History: History reviewed. No pertinent past medical history.  Past Surgical History: Past Surgical History:  Procedure Laterality Date   LEG SURGERY Right    femur fx repair    Obstetrical History: OB History     Gravida  1   Para  0   Term  0   Preterm  0   AB  0   Living  0      SAB  0   IAB  0   Ectopic  0   Multiple  0   Live Births  0           Social History Social History   Socioeconomic History   Marital status: Single    Spouse name: Not on file   Number of children: Not on file   Years of education: Not on file   Highest education level: Not on file  Occupational History   Not on file  Tobacco Use   Smoking status: Never   Smokeless tobacco: Never   Tobacco comments:    cigars/ last smoked last week  Vaping Use   Vaping Use: Former  Substance and Sexual Activity   Alcohol use: Not Currently   Drug use: Not Currently    Types: Marijuana    Comment: Last used December 2021   Sexual activity: Yes    Partners: Male    Birth control/protection: None  Other Topics Concern   Not on file  Social History Narrative   Not on file   Social Determinants of Health   Financial Resource Strain: Not on file  Food Insecurity: Not on file  Transportation Needs: Not on file  Physical Activity: Not on file   Stress: Not on file  Social Connections: Not on file    Family History: Family History  Problem Relation Age of Onset   Healthy Mother    Healthy Father     Allergies: No Known Allergies  Medications Prior to Admission  Medication Sig Dispense Refill Last Dose   Blood Pressure Monitoring (BLOOD PRESSURE KIT) DEVI 1 kit by Does not apply route once a week. (Patient not taking: Reported on 09/07/2020) 1 each 0 Not Taking   docusate sodium (COLACE) 100 MG capsule Take 1 capsule (100 mg total) by mouth 2 (two) times daily. (Patient not taking: Reported on 09/06/2020) 60 capsule 0    Doxylamine-Pyridoxine (DICLEGIS) 10-10 MG TBEC Take 2 tablets by mouth at bedtime. If symptoms persist, add one tablet in the morning and one in the afternoon (Patient not taking: Reported on 09/06/2020) 100 tablet 5    ferrous sulfate 325 (65 FE) MG tablet Take 1 tablet (325 mg total) by mouth every other day. (Patient not taking: Reported on 09/06/2020) 30 tablet 4    ferrous sulfate 325 (65 FE) MG tablet Take 1 tablet (325 mg total)  by mouth every other day. (Patient not taking: Reported on 09/06/2020) 60 tablet 5    metroNIDAZOLE (FLAGYL) 500 MG tablet Take 1 tablet (500 mg total) by mouth 2 (two) times daily. (Patient not taking: Reported on 09/06/2020) 14 tablet 2    ondansetron (ZOFRAN ODT) 4 MG disintegrating tablet Take 1 tablet (4 mg total) by mouth every 8 (eight) hours as needed for nausea or vomiting. (Patient not taking: Reported on 09/06/2020) 60 tablet 2    pantoprazole (PROTONIX) 40 MG tablet Take 1 tablet (40 mg total) by mouth 2 (two) times daily before a meal. (Patient not taking: Reported on 09/06/2020) 60 tablet 3    Prenat-Fe Poly-Methfol-FA-DHA (VITAFOL ULTRA) 29-0.6-0.4-200 MG CAPS Take 1 capsule by mouth daily before breakfast. 90 capsule 4    terconazole (TERAZOL 3) 0.8 % vaginal cream Place 1 applicator vaginally at bedtime. (Patient not taking: Reported on 09/06/2020) 20 g 0      Review of  Systems   All systems reviewed and negative except as stated in HPI  Blood pressure 134/72, pulse (!) 105, temperature 98.3 F (36.8 C), temperature source Oral, resp. rate 20, height _0  (1.549 m), last menstrual period 11/23/2019, SpO2 98 %. General appearance: alert Lungs: clear to auscultation bilaterally Heart: regular rate and rhythm Abdomen: soft, non-tender; bowel sounds normal Extremities: Homans sign is negative, no sign of DVT Presentation: cephalic Fetal monitoringBaseline: 125-130 bpm, Variability: Good {> 6 bpm), Accelerations: Reactive, and Decelerations: Absent Uterine activityFrequency: Every 1-3 minutes Dilation: 2.5 Effacement (%): 90 Station: -2 Exam by:: Tomasa Hose, RN   Prenatal labs: ABO, Rh: --/--/B POS (08/31 2316) Antibody: NEG (08/31 2316) Rubella: 7.26 (02/15 1451) RPR: Reactive (06/07 1037)  HBsAg: Negative (02/15 1451)  HIV: Non Reactive (06/07 1037)  GBS: Positive/-- (08/08 0214)  2 hr Glucola normal (86/136/98) Genetic screening  LR NIPS/AFP negative, 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- had visit with genetic counselor on 05/24/20 and declined carrier screening for father of baby and declined amniocentesis, Anatomy US normal, EFW 96%ile on f/up anatomy on 5/17, 8/8 [place] fetus 74%ile   Prenatal Transfer Tool  Maternal Diabetes: No Genetic Screening: Abnormal:  Results: Other: silent carrier for alpha thalassemia Maternal Ultrasounds/Referrals: Normal Fetal Ultrasounds or other Referrals:  None Maternal Substance Abuse:  No Significant Maternal Medications:  None Significant Maternal Lab Results: Group B Strep positive  Results for orders placed or performed during the hospital encounter of 09/07/20 (from the past 24 hour(s))  CBC   Collection Time: 09/07/20  9:37 PM  Result Value Ref Range   WBC 13.2 (H) 4.0 - 10.5 K/uL   RBC 3.80 (L) 3.87 - 5.11 MIL/uL   Hemoglobin 8.2 (L) 12.0 - 15.0 g/dL   HCT 26.7 (L) 36.0 - 46.0 %    MCV 70.3 (L) 80.0 - 100.0 fL   MCH 21.6 (L) 26.0 - 34.0 pg   MCHC 30.7 30.0 - 36.0 g/dL   RDW 17.5 (H) 11.5 - 15.5 %   Platelets 481 (H) 150 - 400 K/uL   nRBC 0.4 (H) 0.0 - 0.2 %  Comprehensive metabolic panel   Collection Time: 09/07/20  9:37 PM  Result Value Ref Range   Sodium 135 135 - 145 mmol/L   Potassium 3.3 (L) 3.5 - 5.1 mmol/L   Chloride 107 98 - 111 mmol/L   CO2 22 22 - 32 mmol/L   Glucose, Bld 87 70 - 99 mg/dL   BUN <5 (L) 6 - 20 mg/dL  Creatinine, Ser 0.51 0.44 - 1.00 mg/dL   Calcium 8.8 (L) 8.9 - 10.3 mg/dL   Total Protein 6.1 (L) 6.5 - 8.1 g/dL   Albumin 2.5 (L) 3.5 - 5.0 g/dL   AST 14 (L) 15 - 41 U/L   ALT 13 0 - 44 U/L   Alkaline Phosphatase 94 38 - 126 U/L   Total Bilirubin 0.8 0.3 - 1.2 mg/dL   GFR, Estimated >60 >60 mL/min   Anion gap 6 5 - 15  Type and screen [place]   Collection Time: 09/07/20 11:16 PM  Result Value Ref Range   ABO/RH(D) B POS    Antibody Screen NEG    Sample Expiration      09/10/2020,2359 Performed at [facility], [place] [address] [place], [place] [address]   Results for orders placed or performed during the hospital encounter of 09/07/20 (from the past 24 hour(s))  Wet prep, genital   Collection Time: 09/07/20  1:46 PM   Specimen: Cervix  Result Value Ref Range   Yeast Wet Prep HPF POC NONE SEEN NONE SEEN   Trich, Wet Prep NONE SEEN NONE SEEN   Clue Cells Wet Prep HPF POC NONE SEEN NONE SEEN   WBC, Wet Prep HPF POC MANY (A) NONE SEEN   Sperm NONE SEEN   POCT fern test   Collection Time: 09/07/20  2:28 PM  Result Value Ref Range   POCT Fern Test Negative = intact amniotic membranes     Patient Active Problem List   Diagnosis Date Noted   Gestational hypertension 09/07/2020   Elevated platelet count 09/07/2020   Supervision of other normal pregnancy, antepartum 09/07/2020   Biological false positive RPR test 06/28/2020   Encounter for supervision of normal first pregnancy in first trimester  01/19/2020    Assessment/Plan:  Elizabeht Suto is a 20 y.o. G1P0000 at 68w4dhere for IOL for gHTN   #Labor:Cervix 2.5 cm, will plan to place cooks. Contracting too much for cytotec #Pain: IV pain medicine and plans epidural #gHTN: p:c pending, no symptoms at this time, LFTs normal, plt elevated but stable and no thromobocytopenia. #FWB: Cat I #ID:  GBS +, PCN  #MOF: Breast and bottle #MOC: Nexplanon #Circ:  yes  ARenard Matter MD, MPH OB Fellow, Faculty Practice

## 2020-09-08 ENCOUNTER — Inpatient Hospital Stay (HOSPITAL_COMMUNITY): Payer: Medicaid Other | Admitting: Anesthesiology

## 2020-09-08 ENCOUNTER — Encounter (HOSPITAL_COMMUNITY): Payer: Self-pay | Admitting: Obstetrics & Gynecology

## 2020-09-08 DIAGNOSIS — O99824 Streptococcus B carrier state complicating childbirth: Secondary | ICD-10-CM

## 2020-09-08 DIAGNOSIS — O134 Gestational [pregnancy-induced] hypertension without significant proteinuria, complicating childbirth: Secondary | ICD-10-CM

## 2020-09-08 DIAGNOSIS — O411230 Chorioamnionitis, third trimester, not applicable or unspecified: Secondary | ICD-10-CM

## 2020-09-08 DIAGNOSIS — Z3A39 39 weeks gestation of pregnancy: Secondary | ICD-10-CM

## 2020-09-08 LAB — CBC WITH DIFFERENTIAL/PLATELET
Abs Immature Granulocytes: 0.27 10*3/uL — ABNORMAL HIGH (ref 0.00–0.07)
Basophils Absolute: 0 10*3/uL (ref 0.0–0.1)
Basophils Relative: 0 %
Eosinophils Absolute: 0 10*3/uL (ref 0.0–0.5)
Eosinophils Relative: 0 %
HCT: 26.6 % — ABNORMAL LOW (ref 36.0–46.0)
Hemoglobin: 8.2 g/dL — ABNORMAL LOW (ref 12.0–15.0)
Immature Granulocytes: 1 %
Lymphocytes Relative: 5 %
Lymphs Abs: 1 10*3/uL (ref 0.7–4.0)
MCH: 21.5 pg — ABNORMAL LOW (ref 26.0–34.0)
MCHC: 30.8 g/dL (ref 30.0–36.0)
MCV: 69.6 fL — ABNORMAL LOW (ref 80.0–100.0)
Monocytes Absolute: 1.6 10*3/uL — ABNORMAL HIGH (ref 0.1–1.0)
Monocytes Relative: 8 %
Neutro Abs: 16.7 10*3/uL — ABNORMAL HIGH (ref 1.7–7.7)
Neutrophils Relative %: 86 %
Platelets: 494 10*3/uL — ABNORMAL HIGH (ref 150–400)
RBC: 3.82 MIL/uL — ABNORMAL LOW (ref 3.87–5.11)
RDW: 17.6 % — ABNORMAL HIGH (ref 11.5–15.5)
WBC: 19.5 10*3/uL — ABNORMAL HIGH (ref 4.0–10.5)
nRBC: 0.4 % — ABNORMAL HIGH (ref 0.0–0.2)

## 2020-09-08 LAB — RESP PANEL BY RT-PCR (FLU A&B, COVID) ARPGX2
Influenza A by PCR: NEGATIVE
Influenza B by PCR: NEGATIVE
SARS Coronavirus 2 by RT PCR: NEGATIVE

## 2020-09-08 LAB — PROTEIN / CREATININE RATIO, URINE
Creatinine, Urine: 162.39 mg/dL
Protein Creatinine Ratio: 0.15 mg/mg{Cre} (ref 0.00–0.15)
Total Protein, Urine: 25 mg/dL

## 2020-09-08 LAB — TYPE AND SCREEN
ABO/RH(D): B POS
Antibody Screen: NEGATIVE

## 2020-09-08 LAB — RPR: RPR Ser Ql: NONREACTIVE

## 2020-09-08 MED ORDER — ACETAMINOPHEN 10 MG/ML IV SOLN
1000.0000 mg | Freq: Once | INTRAVENOUS | Status: AC
  Administered 2020-09-08: 1000 mg via INTRAVENOUS
  Filled 2020-09-08: qty 100

## 2020-09-08 MED ORDER — EPHEDRINE 5 MG/ML INJ
10.0000 mg | INTRAVENOUS | Status: DC | PRN
  Administered 2020-09-08: 10 mg via INTRAVENOUS

## 2020-09-08 MED ORDER — SENNOSIDES-DOCUSATE SODIUM 8.6-50 MG PO TABS
2.0000 | ORAL_TABLET | ORAL | Status: DC
  Administered 2020-09-08 – 2020-09-10 (×3): 2 via ORAL
  Filled 2020-09-08 (×3): qty 2

## 2020-09-08 MED ORDER — ZOLPIDEM TARTRATE 5 MG PO TABS: 5.0000 mg | ORAL_TABLET | Freq: Every evening | ORAL | Status: DC | PRN

## 2020-09-08 MED ORDER — FENTANYL CITRATE (PF) 100 MCG/2ML IJ SOLN
50.0000 ug | INTRAMUSCULAR | Status: DC | PRN
  Administered 2020-09-08 (×2): 100 ug via INTRAVENOUS
  Filled 2020-09-08 (×2): qty 2

## 2020-09-08 MED ORDER — LIDOCAINE HCL (PF) 1 % IJ SOLN
INTRAMUSCULAR | Status: DC | PRN
  Administered 2020-09-08: 4 mL via EPIDURAL
  Administered 2020-09-08: 5 mL via EPIDURAL

## 2020-09-08 MED ORDER — ONDANSETRON HCL 4 MG PO TABS: 4.0000 mg | ORAL_TABLET | ORAL | Status: DC | PRN

## 2020-09-08 MED ORDER — PHENYLEPHRINE 40 MCG/ML (10ML) SYRINGE FOR IV PUSH (FOR BLOOD PRESSURE SUPPORT): 80.0000 ug | PREFILLED_SYRINGE | INTRAVENOUS | Status: DC | PRN

## 2020-09-08 MED ORDER — COCONUT OIL OIL: 1.0000 "application " | TOPICAL_OIL | Status: DC | PRN

## 2020-09-08 MED ORDER — FENTANYL-BUPIVACAINE-NACL 0.5-0.125-0.9 MG/250ML-% EP SOLN
EPIDURAL | Status: AC
  Filled 2020-09-08: qty 250

## 2020-09-08 MED ORDER — LACTATED RINGERS IV SOLN
500.0000 mL | Freq: Once | INTRAVENOUS | Status: AC
  Administered 2020-09-08: 500 mL via INTRAVENOUS

## 2020-09-08 MED ORDER — ONDANSETRON HCL 4 MG/2ML IJ SOLN: 4.0000 mg | INTRAMUSCULAR | Status: DC | PRN

## 2020-09-08 MED ORDER — OXYTOCIN-SODIUM CHLORIDE 30-0.9 UT/500ML-% IV SOLN: 1.0000 m[IU]/min | INTRAVENOUS | Status: DC

## 2020-09-08 MED ORDER — PRENATAL MULTIVITAMIN CH
1.0000 | ORAL_TABLET | Freq: Every day | ORAL | Status: DC
  Administered 2020-09-09 – 2020-09-10 (×2): 1 via ORAL
  Filled 2020-09-08 (×2): qty 1

## 2020-09-08 MED ORDER — TERBUTALINE SULFATE 1 MG/ML IJ SOLN: 0.2500 mg | Freq: Once | INTRAMUSCULAR | Status: DC | PRN

## 2020-09-08 MED ORDER — TETANUS-DIPHTH-ACELL PERTUSSIS 5-2.5-18.5 LF-MCG/0.5 IM SUSY: 0.5000 mL | PREFILLED_SYRINGE | Freq: Once | INTRAMUSCULAR | Status: DC

## 2020-09-08 MED ORDER — WITCH HAZEL-GLYCERIN EX PADS: 1.0000 "application " | MEDICATED_PAD | CUTANEOUS | Status: DC | PRN

## 2020-09-08 MED ORDER — DIPHENHYDRAMINE HCL 50 MG/ML IJ SOLN: 12.5000 mg | INTRAMUSCULAR | Status: DC | PRN

## 2020-09-08 MED ORDER — SODIUM CHLORIDE 0.9 % IV SOLN
2.0000 g | Freq: Once | INTRAVENOUS | Status: AC
  Administered 2020-09-08: 2 g via INTRAVENOUS
  Filled 2020-09-08: qty 2000

## 2020-09-08 MED ORDER — OXYTOCIN-SODIUM CHLORIDE 30-0.9 UT/500ML-% IV SOLN
1.0000 m[IU]/min | INTRAVENOUS | Status: DC
  Administered 2020-09-08: 2 m[IU]/min via INTRAVENOUS
  Filled 2020-09-08: qty 500

## 2020-09-08 MED ORDER — TRANEXAMIC ACID-NACL 1000-0.7 MG/100ML-% IV SOLN: 1000.0000 mg | INTRAVENOUS | Status: DC

## 2020-09-08 MED ORDER — BENZOCAINE-MENTHOL 20-0.5 % EX AERO
1.0000 "application " | INHALATION_SPRAY | CUTANEOUS | Status: DC | PRN
  Administered 2020-09-08 – 2020-09-10 (×2): 1 via TOPICAL
  Filled 2020-09-08 (×2): qty 56

## 2020-09-08 MED ORDER — DIPHENHYDRAMINE HCL 25 MG PO CAPS: 25.0000 mg | ORAL_CAPSULE | Freq: Four times a day (QID) | ORAL | Status: DC | PRN

## 2020-09-08 MED ORDER — IBUPROFEN 600 MG PO TABS
600.0000 mg | ORAL_TABLET | Freq: Four times a day (QID) | ORAL | Status: DC
  Administered 2020-09-08 – 2020-09-10 (×7): 600 mg via ORAL
  Filled 2020-09-08 (×8): qty 1

## 2020-09-08 MED ORDER — EPHEDRINE 5 MG/ML INJ
10.0000 mg | INTRAVENOUS | Status: DC | PRN
  Filled 2020-09-08: qty 5

## 2020-09-08 MED ORDER — DIBUCAINE (PERIANAL) 1 % EX OINT: 1.0000 "application " | TOPICAL_OINTMENT | CUTANEOUS | Status: DC | PRN

## 2020-09-08 MED ORDER — ACETAMINOPHEN 325 MG PO TABS
650.0000 mg | ORAL_TABLET | ORAL | Status: DC | PRN
  Administered 2020-09-08: 650 mg via ORAL
  Filled 2020-09-08: qty 2

## 2020-09-08 MED ORDER — FENTANYL-BUPIVACAINE-NACL 0.5-0.125-0.9 MG/250ML-% EP SOLN
12.0000 mL/h | EPIDURAL | Status: DC | PRN
Start: 2020-09-08 — End: 2020-09-08
  Administered 2020-09-08: 12 mL/h via EPIDURAL

## 2020-09-08 MED ORDER — SIMETHICONE 80 MG PO CHEW: 80.0000 mg | CHEWABLE_TABLET | ORAL | Status: DC | PRN

## 2020-09-08 MED ORDER — TRANEXAMIC ACID-NACL 1000-0.7 MG/100ML-% IV SOLN
INTRAVENOUS | Status: AC
  Filled 2020-09-08: qty 100

## 2020-09-08 MED ORDER — MISOPROSTOL 25 MCG QUARTER TABLET: 25.0000 ug | ORAL_TABLET | ORAL | Status: DC | PRN

## 2020-09-08 MED ORDER — GENTAMICIN SULFATE 40 MG/ML IJ SOLN
5.0000 mg/kg | INTRAVENOUS | Status: AC
  Administered 2020-09-08: 320 mg via INTRAVENOUS
  Filled 2020-09-08: qty 8

## 2020-09-08 NOTE — Progress Notes (Signed)
Tricia Morrison is a 20 y.o. G1P0000 at [redacted]w[redacted]d admitted for induction of labor due to [facility]  Subjective: Somewhat more comfortable after IV Fentanyl, Still feeling strong contractions Objective: BP 134/72 (BP Location: Right Arm)   Pulse (!) 105   Temp 98.3 F (36.8 C) (Oral)   Resp 20   Ht 5\' 1"  (1.549 m)   LMP 11/23/2019   SpO2 98%   BMI 36.54 kg/m  No intake/output data recorded. No intake/output data recorded.  FHT:  FHR: 125 bpm, variability: moderate,  accelerations:  Abscent,  decelerations:  Absent UC:   regular, every 3-5 minutes SVE:   Dilation: 3 Effacement (%): 90 Station: -2 Exam by:: [email or web address], RN  Labs: Lab Results  Component Value Date   WBC 13.2 (H) 09/07/2020   HGB 8.2 (L) 09/07/2020   HCT 26.7 (L) 09/07/2020   MCV 70.3 (L) 09/07/2020   PLT 481 (H) 09/07/2020    Assessment / Plan: Induction of labor due to gHTN  Labor:  Patient SROMed at 0047. attempted to place cooks, patient stretching out to 3.5 cm and cooks did not stay in. Will plan for pitocin and can place internal monitors if needed for titration.  Preeclampsia:  no signs or symptoms of toxicity and labs stable Fetal Wellbeing:  Category I Pain Control:  IV pain meds I/D:   GBS postitive, PCN Anticipated MOD:  NSVD  09/09/2020 09/08/2020, 1:14 AM

## 2020-09-08 NOTE — Lactation Note (Signed)
This note was copied from a baby's chart. Lactation Consultation Note  Patient Name: Tricia Morrison [id number] Date: 09/08/2020 Reason for consult: L&D Initial assessment;Primapara;Term Age:21 hours  Mom-baby seen while on L&D. Infant latched with ease & Mom was comfortable with latch. Mother's questions were answered.   Infant was removed from the breast so that L&D nurses could assess infant. Lactation to f/u later.  [name] [facility] 09/08/2020, 11:50 AM

## 2020-09-08 NOTE — Lactation Note (Addendum)
This note was copied from a baby's chart. Lactation Consultation Note  Patient Name: Tricia Morrison Date: 09/08/2020 Reason for consult: Initial assessment;Mother's request;Difficult latch;Primapara;1st time breastfeeding;Term Age:21 hours  LC assisted getting infant to latch after breast. Mom tried most of day infant very sleepy.   Plan 1. To feed based on cues 8-12x 24 hr period.  2. Mom to offer breasts and ensure milk transfer.  3. If unable to latch, Mom to express breast milk in a spoon and offer to infant 4. I and O sheet reviewed.  5. LC inpatient and outpatient services reviewed.  All questions answered at the end of the visit.  LC talked with RN Donalynn Furlong to assist Mom with next feeding. Mom large breasts and compressible nipples hard to get infant to latch.   Maternal Data Has patient been taught Hand Expression?: Yes  Feeding Mother's Current Feeding Choice: Breast Milk  LATCH Score Latch: Repeated attempts needed to sustain latch, nipple held in mouth throughout feeding, stimulation needed to elicit sucking reflex.  Audible Swallowing: Spontaneous and intermittent  Type of Nipple: Everted at rest and after stimulation  Comfort (Breast/Nipple): Soft / non-tender  Hold (Positioning): Assistance needed to correctly position infant at breast and maintain latch.  LATCH Score: 8   Lactation Tools Discussed/Used Tools: Pump;Flanges Flange Size: 24 Breast pump type: Manual Pump Education: Setup, frequency, and cleaning;Milk Storage Reason for Pumping: increase let down Pumping frequency: every 3 hrs for 10 mins  Interventions Interventions: Breast feeding basics reviewed;Position options;Assisted with latch;Expressed milk;Skin to skin;Breast massage;Hand express;Hand pump;Breast compression;Adjust position;Education;Support pillows  Discharge    Consult Status Consult Status: Follow-up Date: 09/09/20 Follow-up type: In-patient    Yenni Carra   N[facility]olson[facility]Springer 09/08/2020, 7:48 PM

## 2020-09-08 NOTE — Lactation Note (Signed)
This note was copied from a baby's chart. Lactation Consultation Note  Patient Name: Tricia Morrison Date: 09/08/2020   Age:21 hours  LC went to assist Mom with latching. Mom eating dinner at this time and asked me to return. LC talked with RN, Vivi Martens to follow up with Mom on feeding since last feeding in chart 12:30 pm.   Mom to call for latch assistance after dinner.    Maternal Data    Feeding    LATCH Score                    Lactation Tools Discussed/Used    Interventions    Discharge    Consult Status      Tricia Driggers  Morrison 09/08/2020, 5:58 PM

## 2020-09-08 NOTE — Lactation Note (Signed)
This note was copied from a baby's chart. Lactation Consultation Note  Patient Name: Tricia Morrison [id number] Date: 09/08/2020 Reason for consult: Mother's request Age:21 hours  LC in to room per mother's request. Mother requests assistance with latch. Infant seems sleepy and uninterested. Several attempts to latch unsuccessful, placed infant back skin to skin. Mother states infant breastfed ~1h prior to this [facility] visit.  Encouraged to use hand pump for nipple eversion/firmness prior to latch.   Maternal Data Has patient been taught Hand Expression?: Yes  Feeding Mother's Current Feeding Choice: Breast Milk  LATCH Score Latch: Too sleepy or reluctant, no latch achieved, no sucking elicited.  Audible Swallowing: None  Type of Nipple: Everted at rest and after stimulation (very soft tissue, everts with hand pump)  Comfort (Breast/Nipple): Soft / non-tender  Hold (Positioning): Assistance needed to correctly position infant at breast and maintain latch.  LATCH Score: 5   Lactation Tools Discussed/Used Tools: Pump Flange Size: 24 Breast pump type: Manual Pump Education: Setup, frequency, and cleaning;Milk Storage Reason for Pumping: nipple eversion Pumping frequency: pre-pumping  Interventions Interventions: Assisted with latch;Skin to skin;Expressed milk;Hand express;Hand pump;Pre-pump if needed  Discharge    Consult Status Consult Status: Follow-up Date: 09/09/20 Follow-up type: In-patient    Laquita Harlan A Higuera Ancidey 09/08/2020, 9:19 PM

## 2020-09-08 NOTE — Anesthesia Preprocedure Evaluation (Signed)
Anesthesia Evaluation  Patient identified by MRN, date of birth, ID band Patient awake    Reviewed: Allergy & Precautions, NPO status , Patient's Chart, lab work & pertinent test results  History of Anesthesia Complications Negative for: history of anesthetic complications  Airway Mallampati: II   Neck ROM: Full    Dental   Pulmonary neg pulmonary ROS,    Pulmonary exam normal        Cardiovascular hypertension, Normal cardiovascular exam     Neuro/Psych negative neurological ROS  negative psych ROS   GI/Hepatic negative GI ROS, Neg liver ROS,   Endo/Other  negative endocrine ROS Obesity   Renal/GU negative Renal ROS     Musculoskeletal negative musculoskeletal ROS (+)   Abdominal   Peds  Hematology  (+) anemia ,  Plt 481k    Anesthesia Other Findings   Reproductive/Obstetrics (+) Pregnancy                             Anesthesia Physical Anesthesia Plan  ASA: 2  Anesthesia Plan: Epidural   Post-op Pain Management:    Induction:   PONV Risk Score and Plan: 2 and Treatment may vary due to age or medical condition  Airway Management Planned: Natural Airway  Additional Equipment: None  Intra-op Plan:   Post-operative Plan:   Informed Consent: I have reviewed the patients History and Physical, chart, labs and discussed the procedure including the risks, benefits and alternatives for the proposed anesthesia with the patient or authorized representative who has indicated his/her understanding and acceptance.       Plan Discussed with: Anesthesiologist  Anesthesia Plan Comments: (Labs reviewed. Platelets acceptable, patient not taking any blood thinning medications. Per RN, FHR tracing reported to be stable enough for sitting procedure. Risks and benefits discussed with patient, including PDPH, backache, epidural hematoma, failed epidural, blood pressure changes, allergic  reaction, and nerve injury. Patient expressed understanding and wished to proceed.)        Anesthesia Quick Evaluation

## 2020-09-08 NOTE — Anesthesia Procedure Notes (Signed)
Epidural Patient location during procedure: OB Start time: 09/08/2020 2:53 AM End time: 09/08/2020 2:56 AM  Staffing Anesthesiologist: Beryle Lathe, MD Performed: anesthesiologist   Preanesthetic Checklist Completed: patient identified, IV checked, risks and benefits discussed, monitors and equipment checked, pre-op evaluation and timeout performed  Epidural Patient position: sitting Prep: DuraPrep Patient monitoring: continuous pulse ox and blood pressure Approach: midline Location: L2-L3 Injection technique: LOR saline  Needle:  Needle type: Tuohy  Needle gauge: 17 G Needle length: 9 cm Needle insertion depth: 7 cm Catheter size: 19 Gauge Catheter at skin depth: 12 cm Test dose: negative and Other (1% lidocaine)  Assessment Events: blood not aspirated  Additional Notes Patient identified. Risks including, but not limited to, bleeding, infection, nerve damage, paralysis, inadequate analgesia, blood pressure changes, nausea, vomiting, allergic reaction, postpartum back pain, itching, and headache were discussed. Patient expressed understanding and wished to proceed. Sterile prep and drape, including hand hygiene, mask, and sterile gloves were used. The patient was positioned and the spine was prepped. The skin was anesthetized with lidocaine. No paraesthesia or other complication noted. The patient did not experience any signs of intravascular injection such as tinnitus or metallic taste in mouth, nor signs of intrathecal spread such as rapid motor block. Please see nursing notes for vital signs. The patient tolerated the procedure well.   Leslye Peer, MDReason for block:procedure for pain

## 2020-09-08 NOTE — Discharge Summary (Signed)
Postpartum Discharge Summary  Date of Service updated     Patient Name: Tricia Morrison DOB: Nov 02, 2000 MRN: [id number]  Date of admission: 09/07/2020 Delivery date:09/08/2020  Delivering provider: Gifford Shave  Date of discharge: 09/10/2020  Admitting diagnosis: Supervision of other normal pregnancy, antepartum [Z34.80] Intrauterine pregnancy: [redacted]w[redacted]d    Secondary diagnosis:  Active Problems:   Gestational hypertension   Elevated platelet count   Supervision of other normal pregnancy, antepartum   Acute blood loss anemia  Additional problems: None    Discharge diagnosis: Term Pregnancy Delivered and Gestational Hypertension                                              Post partum procedures: None Augmentation: Pitocin Complications: Intrauterine Inflammation or infection (Chorioamniotis), treated with amp/gent.   Hospital course: Onset of Labor With Vaginal Delivery      20y.o. yo G1P1001 at 369w5das admitted in Latent Labor on 09/07/2020. Patient had an uncomplicated labor course as follows:  Membrane Rupture Time/Date: 12:47 AM ,09/08/2020   Delivery Method:Vaginal, Spontaneous  Episiotomy: None  Lacerations:  Labial;Perineal  Patient had an uncomplicated postpartum course. Her post-delivery hemoglobin was 7.6 from 8.2 on admit. She was asymptomatic and offered IV venofer vs oral iron, she opted to restart oral iron outpatient. She is ambulating, tolerating a regular diet, passing flatus, and urinating well. Patient is discharged home in stable condition on 09/10/20.  Newborn Data: Birth date:09/08/2020  Birth time:10:29 AM  Gender:Female  Living status:Living  Apgars:7 ,9  Weight:3059 g   Magnesium Sulfate received: No BMZ received: No Rhophylac:N/A MMR:No T-DaP: Declined prenatally Flu: No Transfusion:No  Physical exam  Vitals:   09/09/20 0959 09/09/20 1410 09/09/20 2019 09/10/20 0504  BP: 113/72 120/63 100/76 116/67  Pulse: 97 98 66 80  Resp:   18 18   Temp:  98.3 F (36.8 C) 97.8 F (36.6 C) 98.3 F (36.8 C)  TempSrc:  Oral Oral Oral  SpO2: 100% 100%    Height:       General: alert, cooperative, and no distress Lochia: appropriate Uterine Fundus: firm Incision: N/A DVT Evaluation: No significant calf/ankle edema. Labs: Lab Results  Component Value Date   WBC 28.6 (H) 09/09/2020   HGB 7.6 (L) 09/09/2020   HCT 25.2 (L) 09/09/2020   MCV 70.0 (L) 09/09/2020   PLT 461 (H) 09/09/2020   CMP Latest Ref Rng & Units 09/07/2020  Glucose 70 - 99 mg/dL 87  BUN 6 - 20 mg/dL <5(L)  Creatinine 0.44 - 1.00 mg/dL 0.51  Sodium 135 - 145 mmol/L 135  Potassium 3.5 - 5.1 mmol/L 3.3(L)  Chloride 98 - 111 mmol/L 107  CO2 22 - 32 mmol/L 22  Calcium 8.9 - 10.3 mg/dL 8.8(L)  Total Protein 6.5 - 8.1 g/dL 6.1(L)  Total Bilirubin 0.3 - 1.2 mg/dL 0.8  Alkaline Phos 38 - 126 U/L 94  AST 15 - 41 U/L 14(L)  ALT 0 - 44 U/L 13   Edinburgh Score: Edinburgh Postnatal Depression Scale Screening Tool 09/10/2020  I have been able to laugh and see the funny side of things. 0  I have looked forward with enjoyment to things. 0  I have blamed myself unnecessarily when things went wrong. 0  I have been anxious or worried for no good reason. 0  I have felt scared or  panicky for no good reason. 1  Things have been getting on top of me. 1  I have been so unhappy that I have had difficulty sleeping. 0  I have felt sad or miserable. 0  I have been so unhappy that I have been crying. 0  The thought of harming myself has occurred to me. 0  Edinburgh Postnatal Depression Scale Total 2     After visit meds:  Allergies as of 09/10/2020   No Known Allergies      Medication List     STOP taking these medications    Doxylamine-Pyridoxine 10-10 MG Tbec Commonly known as: Diclegis   metroNIDAZOLE 500 MG tablet Commonly known as: FLAGYL   ondansetron 4 MG disintegrating tablet Commonly known as: Zofran ODT   pantoprazole 40 MG tablet Commonly known as:  Protonix   terconazole 0.8 % vaginal cream Commonly known as: TERAZOL 3   Vitafol Ultra 29-0.6-0.4-200 MG Caps       TAKE these medications    acetaminophen 325 MG tablet Commonly known as: Tylenol Take 2 tablets (650 mg total) by mouth every 4 (four) hours as needed (for pain scale < 4).   Blood Pressure Kit Devi 1 kit by Does not apply route once a week.   docusate sodium 100 MG capsule Commonly known as: COLACE Take 1 capsule (100 mg total) by mouth 2 (two) times daily.   ferrous sulfate 325 (65 FE) MG tablet Take 1 tablet (325 mg total) by mouth every other day. What changed: Another medication with the same name was removed. Continue taking this medication, and follow the directions you see here.   ibuprofen 600 MG tablet Commonly known as: ADVIL Take 1 tablet (600 mg total) by mouth every 6 (six) hours.         Discharge home in stable condition Infant Feeding: Bottle and Breast Infant Disposition:home with mother Discharge instruction: per After Visit Summary and Postpartum booklet. Activity: Advance as tolerated. Pelvic rest for 6 weeks.  Diet: routine diet Future Appointments: Future Appointments  Date Time Provider [place]  10/10/2020  1:10 PM Shelly Bombard, MD [place] None    Follow up Visit: Please schedule this patient for a In person postpartum visit in 4 weeks with the following provider: Any provider. Additional Postpartum F/U:BP check 1 week  Low risk pregnancy complicated by: HTN Delivery mode:  Vaginal, Spontaneous  Anticipated Birth Control:  PP Nexplanon placed   09/10/2020 Patriciaann Clan, DO

## 2020-09-08 NOTE — Progress Notes (Signed)
Labor Progress Note Tricia Morrison is a 20 y.o. G1P0000 at 105w5d presented for IOL for gHTN  S:  Called to room to assess fever=101.4 and dilation status. Pt very shivery but otherwise comfortable with her epidural. FOB and family at bedside, very engaged and supportive.  O:  BP (!) 148/89   Pulse (!) 114   Temp (!) 101.2 F (38.4 C) (Axillary)   Resp 18   Ht 5\' 1"  (1.549 m)   LMP 11/23/2019   SpO2 100%   BMI 36.54 kg/m  EFM: baseline 170 bpm/ moderate variability/ 15x15 accels/ repetitive variable decels  Toco/IUPC: q31min SVE: Dilation: 10 Dilation Complete Date: 09/08/20 Dilation Complete Time: 0915 Effacement (%): 100 Cervical Position: Middle Station: 0 Presentation: Vertex Exam by:: [email or web address], MD Pitocin: 2 mu/min  A/P: 20 y.o. G1P0000 [redacted]w[redacted]d  1. Labor: Progressing well on very little Pitocin 2. FWB: Cat 2 due to variables, but still good variability, MDs aware. Will monitor closely while laboring down. Pt given LR bolus and IV Tylenol for fever. 3. Pain: Well controlled with epidural and family support  4. Fever: PCN stopped, switched to amp/gent 5. gHTN: stable, no severe range BP.  Cautiously anticipate SVD.  [redacted]w[redacted]d, CNM 9:28 AM

## 2020-09-09 LAB — CBC
HCT: 25.2 % — ABNORMAL LOW (ref 36.0–46.0)
Hemoglobin: 7.6 g/dL — ABNORMAL LOW (ref 12.0–15.0)
MCH: 21.1 pg — ABNORMAL LOW (ref 26.0–34.0)
MCHC: 30.2 g/dL (ref 30.0–36.0)
MCV: 70 fL — ABNORMAL LOW (ref 80.0–100.0)
Platelets: 461 10*3/uL — ABNORMAL HIGH (ref 150–400)
RBC: 3.6 MIL/uL — ABNORMAL LOW (ref 3.87–5.11)
RDW: 17.6 % — ABNORMAL HIGH (ref 11.5–15.5)
WBC: 28.6 10*3/uL — ABNORMAL HIGH (ref 4.0–10.5)
nRBC: 0.2 % (ref 0.0–0.2)

## 2020-09-09 NOTE — Progress Notes (Signed)
Post Partum Day 1 Subjective: no complaints, up ad lib, voiding and tolerating PO, small lochia, plans to breastfeed,  plans nexplanon inpt  Objective: Blood pressure 122/67, pulse 90, temperature 97.9 F (36.6 C), temperature source Oral, resp. rate 17, height 5\' 1"  (1.549 m), last menstrual period 11/23/2019, SpO2 100 %, unknown if currently breastfeeding.  Physical Exam:  General: alert, cooperative and no distress Lochia:normal flow Chest: CTAB Heart: RRR no m/r/g Abdomen: +BS, soft, nontender,  Uterine Fundus: firm DVT Evaluation: No evidence of DVT seen on physical exam. Extremities: trace edema  Recent Labs    09/08/20 1121 09/09/20 0519  HGB 8.2* 7.6*  HCT 26.6* 25.2*    Assessment/Plan: Plan for discharge tomorrow, Breastfeeding, and Lactation consult   LOS: 2 days   11/09/20 09/09/2020, 7:49 AM

## 2020-09-09 NOTE — Anesthesia Postprocedure Evaluation (Signed)
Anesthesia Post Note  Patient: Tricia Morrison  Procedure(s) Performed: AN AD HOC LABOR EPIDURAL     Patient location during evaluation: Mother Baby Anesthesia Type: Epidural Level of consciousness: awake, oriented and awake and alert Pain management: pain level controlled Vital Signs Assessment: post-procedure vital signs reviewed and stable Respiratory status: spontaneous breathing, respiratory function stable and nonlabored ventilation Cardiovascular status: stable Postop Assessment: no headache, adequate PO intake, able to ambulate, patient able to bend at knees and no apparent nausea or vomiting Anesthetic complications: no   No notable events documented.  Last Vitals:  Vitals:   09/09/20 0132 09/09/20 0526  BP: 119/68 122/67  Pulse: 83 90  Resp: 16 17  Temp: 36.9 C 36.6 C  SpO2: 98% 100%    Last Pain:  Vitals:   09/09/20 0526  TempSrc: Oral  PainSc:    Pain Goal:                   [name]

## 2020-09-09 NOTE — Lactation Note (Signed)
This note was copied from a baby's chart. Lactation Consultation Note  Patient Name: Boy [place] Freshour [id number]'Z Date: 09/09/2020 Reason for consult: Follow-up assessment;1st time breastfeeding;Term Age:21 hours LC entered the room, mom holding infant and ask LC to place sleeping infant in basinet. Mom feels breastfeeding is going well she doesn't have any questions or concerns for LC at this time.  Per mom, infant has improved and is sustaining latch, recently finished breastfeeding for 25 minutes prior to [facility] entering the room. Mom will continue to breastfeed infant according to feeding cues, 8 to 12+ times within 24 hours, skin to skin. Mom knows to call RN/LC if she has questions, concerns or need assistance with latching infant at the breast.  Maternal Data    Feeding Mother's Current Feeding Choice: Breast Milk  LATCH Score              Lactation Tools Discussed/Used    Interventions    Discharge    Consult Status Consult Status: Follow-up Date: 09/10/20 Follow-up type: In-patient    Danelle Earthly 09/09/2020, 10:49 PM

## 2020-09-10 DIAGNOSIS — Z30017 Encounter for initial prescription of implantable subdermal contraceptive: Secondary | ICD-10-CM

## 2020-09-10 DIAGNOSIS — D62 Acute posthemorrhagic anemia: Secondary | ICD-10-CM

## 2020-09-10 MED ORDER — IBUPROFEN 600 MG PO TABS: 600.0000 mg | ORAL_TABLET | Freq: Four times a day (QID) | ORAL | 0 refills | Status: AC

## 2020-09-10 MED ORDER — ETONOGESTREL 68 MG ~~LOC~~ IMPL
68.0000 mg | DRUG_IMPLANT | Freq: Once | SUBCUTANEOUS | Status: AC
  Administered 2020-09-10: 68 mg via SUBCUTANEOUS
  Filled 2020-09-10: qty 1

## 2020-09-10 MED ORDER — ACETAMINOPHEN 325 MG PO TABS: 650.0000 mg | ORAL_TABLET | ORAL | Status: AC | PRN

## 2020-09-10 MED ORDER — LIDOCAINE HCL 1 % IJ SOLN
0.0000 mL | Freq: Once | INTRAMUSCULAR | Status: DC | PRN
  Filled 2020-09-10: qty 20

## 2020-09-10 NOTE — Lactation Note (Signed)
This note was copied from a baby's chart. Lactation Consultation Note  Patient Name: Tricia Morrison [id number] Date: 09/10/2020 Reason for consult: Follow-up assessment;Mother's request;Term;[place] Age:21 hours  I checked on Mom; infant was feeding again. Swallows are audible to the naked ear (frequent swallows verified by cervical auscultation). Mom is improving in her ability to latch infant, etc.   A size 21 flange is appropriate for her L breast. As Mom was feeding on her R breast, I was unable to determine if a size 21 or 24 was better, but Mom will use the one most comfortable.  I showed Dad how to wash breast pump parts. Mom does not need to pump; she only needs to pump for comfort.  I have no concerns about infant's feeding.  [name] [facility] 09/10/2020, 2:38 PM

## 2020-09-10 NOTE — Plan of Care (Signed)
  Problem: Education: Goal: Knowledge of General Education information will improve Description: Including pain rating scale, medication(s)/side effects and non-pharmacologic comfort measures Outcome: Completed/Met   Problem: Health Behavior/Discharge Planning: Goal: Ability to manage health-related needs will improve Outcome: Completed/Met   Problem: Clinical Measurements: Goal: Ability to maintain clinical measurements within normal limits will improve Outcome: Completed/Met Goal: Will remain free from infection Outcome: Completed/Met Goal: Diagnostic test results will improve Outcome: Completed/Met Goal: Respiratory complications will improve Outcome: Completed/Met Goal: Cardiovascular complication will be avoided Outcome: Completed/Met   Problem: Activity: Goal: Risk for activity intolerance will decrease Outcome: Completed/Met   Problem: Nutrition: Goal: Adequate nutrition will be maintained Outcome: Completed/Met   Problem: Coping: Goal: Level of anxiety will decrease Outcome: Completed/Met   Problem: Elimination: Goal: Will not experience complications related to bowel motility Outcome: Completed/Met Goal: Will not experience complications related to urinary retention Outcome: Completed/Met   Problem: Pain Managment: Goal: General experience of comfort will improve Outcome: Completed/Met   Problem: Safety: Goal: Ability to remain free from injury will improve Outcome: Completed/Met   Problem: Skin Integrity: Goal: Risk for impaired skin integrity will decrease Outcome: Completed/Met   Problem: Education: Goal: Knowledge of General Education information will improve Description: Including pain rating scale, medication(s)/side effects and non-pharmacologic comfort measures Outcome: Completed/Met   Problem: Health Behavior/Discharge Planning: Goal: Ability to manage health-related needs will improve Outcome: Completed/Met   Problem: Clinical  Measurements: Goal: Ability to maintain clinical measurements within normal limits will improve Outcome: Completed/Met Goal: Will remain free from infection Outcome: Completed/Met Goal: Diagnostic test results will improve Outcome: Completed/Met Goal: Respiratory complications will improve Outcome: Completed/Met Goal: Cardiovascular complication will be avoided Outcome: Completed/Met   Problem: Activity: Goal: Risk for activity intolerance will decrease Outcome: Completed/Met   Problem: Nutrition: Goal: Adequate nutrition will be maintained Outcome: Completed/Met   Problem: Coping: Goal: Level of anxiety will decrease Outcome: Completed/Met   Problem: Elimination: Goal: Will not experience complications related to bowel motility Outcome: Completed/Met Goal: Will not experience complications related to urinary retention Outcome: Completed/Met   Problem: Pain Managment: Goal: General experience of comfort will improve Outcome: Completed/Met   Problem: Safety: Goal: Ability to remain free from injury will improve Outcome: Completed/Met   Problem: Skin Integrity: Goal: Risk for impaired skin integrity will decrease Outcome: Completed/Met   Problem: Education: Goal: Knowledge of condition will improve Outcome: Completed/Met   Problem: Activity: Goal: Will verbalize the importance of balancing activity with adequate rest periods Outcome: Completed/Met Goal: Ability to tolerate increased activity will improve Outcome: Completed/Met

## 2020-09-10 NOTE — Lactation Note (Signed)
This note was copied from a baby's chart. Lactation Consultation Note  Patient Name: Tricia Morrison [id number] Date: 09/10/2020   Age:21 hours   Infant latched with ease, but Mom needed help with positioning. Mom comfortable with latch. Frequent swallows verified by cervical auscultation (with a suck:swallow ratio of 1:1 noted). Mom denies increased breast heaviness at this time.  Mom only has a hand pump for home use. Mom may need a size 21 flange on her L breast. Parents will call for me to return when infant is done feeding.   [name] [facility] 09/10/2020, 12:26 PM

## 2020-09-10 NOTE — Lactation Note (Signed)
This note was copied from a baby's chart. Lactation Consultation Note  Patient Name: Tricia Morrison [id number] Date: 09/10/2020   Age:21 hours  LC visit attempted; Mom was in bathroom & infant was sleeping. LC to f/u later.   [name] [facility] 09/10/2020, 11:02 AM

## 2020-09-10 NOTE — Plan of Care (Signed)
  Problem: Education: Goal: Knowledge of General Education information will improve Description: Including pain rating scale, medication(s)/side effects and non-pharmacologic comfort measures Outcome: Completed/Met   Problem: Health Behavior/Discharge Planning: Goal: Ability to manage health-related needs will improve Outcome: Completed/Met   Problem: Clinical Measurements: Goal: Ability to maintain clinical measurements within normal limits will improve Outcome: Completed/Met Goal: Will remain free from infection Outcome: Completed/Met Goal: Diagnostic test results will improve Outcome: Completed/Met Goal: Respiratory complications will improve Outcome: Completed/Met Goal: Cardiovascular complication will be avoided Outcome: Completed/Met   Problem: Activity: Goal: Risk for activity intolerance will decrease Outcome: Completed/Met   Problem: Nutrition: Goal: Adequate nutrition will be maintained Outcome: Completed/Met   Problem: Coping: Goal: Level of anxiety will decrease Outcome: Completed/Met   Problem: Elimination: Goal: Will not experience complications related to bowel motility Outcome: Completed/Met Goal: Will not experience complications related to urinary retention Outcome: Completed/Met   Problem: Pain Managment: Goal: General experience of comfort will improve Outcome: Completed/Met   Problem: Safety: Goal: Ability to remain free from injury will improve Outcome: Completed/Met   Problem: Skin Integrity: Goal: Risk for impaired skin integrity will decrease Outcome: Completed/Met   Problem: Education: Goal: Knowledge of General Education information will improve Description: Including pain rating scale, medication(s)/side effects and non-pharmacologic comfort measures Outcome: Completed/Met   Problem: Health Behavior/Discharge Planning: Goal: Ability to manage health-related needs will improve Outcome: Completed/Met   Problem: Clinical  Measurements: Goal: Ability to maintain clinical measurements within normal limits will improve Outcome: Completed/Met Goal: Will remain free from infection Outcome: Completed/Met Goal: Diagnostic test results will improve Outcome: Completed/Met Goal: Respiratory complications will improve Outcome: Completed/Met Goal: Cardiovascular complication will be avoided Outcome: Completed/Met   Problem: Activity: Goal: Risk for activity intolerance will decrease Outcome: Completed/Met   Problem: Nutrition: Goal: Adequate nutrition will be maintained Outcome: Completed/Met   Problem: Coping: Goal: Level of anxiety will decrease Outcome: Completed/Met   Problem: Elimination: Goal: Will not experience complications related to bowel motility Outcome: Completed/Met Goal: Will not experience complications related to urinary retention Outcome: Completed/Met   Problem: Pain Managment: Goal: General experience of comfort will improve Outcome: Completed/Met   Problem: Safety: Goal: Ability to remain free from injury will improve Outcome: Completed/Met   Problem: Skin Integrity: Goal: Risk for impaired skin integrity will decrease Outcome: Completed/Met   Problem: Education: Goal: Knowledge of condition will improve Outcome: Completed/Met   Problem: Activity: Goal: Will verbalize the importance of balancing activity with adequate rest periods Outcome: Completed/Met Goal: Ability to tolerate increased activity will improve Outcome: Completed/Met   Problem: Coping: Goal: Ability to identify and utilize available resources and services will improve Outcome: Completed/Met   Problem: Life Cycle: Goal: Chance of risk for complications during the postpartum period will decrease Outcome: Completed/Met   Problem: Role Relationship: Goal: Ability to demonstrate positive interaction with newborn will improve Outcome: Completed/Met   Problem: Skin Integrity: Goal: Demonstration of  wound healing without infection will improve Outcome: Completed/Met

## 2020-09-10 NOTE — Procedures (Signed)
     GYNECOLOGY OFFICE PROCEDURE NOTE  Tricia Morrison is a 20 y.o. G1P1001 here for  Nexplanon insertion.  Last pap smear not noted, pt is 20 years old.  No other gynecologic concerns.  Nexplanon Insertion Procedure Patient identified, informed consent performed, consent signed.   Patient does understand that irregular bleeding is a very common side effect of this medication. She was advised to have backup contraception for one week after placement. Device placed in the hospital during the postpartum course.  Appropriate time out taken.  Patient's left arm was prepped and draped in the usual sterile fashion. The ruler used to measure and mark insertion area.  Patient was prepped with alcohol swab and then injected with 5 ml of 1% lidocaine.  She was prepped with betadine, Nexplanon removed from packaging,  Device confirmed in needle, then inserted full length of needle and withdrawn per handbook instructions. Nexplanon was able to palpated in the patient's arm; patient palpated the insert herself. There was minimal blood loss.  Patient insertion site covered with guaze and a pressure bandage to reduce any bruising.  The patient tolerated the procedure well and was given post procedure instructions.     Mariel Aloe, MD, FACOG Obstetrician & Gynecologist, [facility] for [facility], [facility]

## 2020-09-13 ENCOUNTER — Encounter: Payer: Medicaid Other | Admitting: Women's Health

## 2020-09-13 LAB — SURGICAL PATHOLOGY

## 2020-09-17 ENCOUNTER — Inpatient Hospital Stay (HOSPITAL_COMMUNITY): Payer: Medicaid Other

## 2020-09-17 ENCOUNTER — Inpatient Hospital Stay (HOSPITAL_COMMUNITY): Admission: AD | Admit: 2020-09-17 | Payer: Medicaid Other | Source: Home / Self Care | Admitting: Obstetrics

## 2020-09-23 ENCOUNTER — Telehealth (HOSPITAL_COMMUNITY): Payer: Self-pay | Admitting: *Deleted

## 2020-09-23 NOTE — Telephone Encounter (Signed)
Mom reports feeling well. No concerns about herself. EPDS=2 [facility] score=2) Mom reports baby is fine. Has 2 week check up today. Breastfeeding well. Peeing and pooping without difficulty. Sleeps in bassinet on back. No concerns about baby.  Duffy Rhody, RN 09-23-2020 at 9:35am

## 2020-10-10 ENCOUNTER — Ambulatory Visit: Payer: Medicaid Other | Admitting: Obstetrics

## 2020-10-11 ENCOUNTER — Ambulatory Visit: Payer: Medicaid Other | Admitting: Obstetrics

## 2020-11-21 ENCOUNTER — Ambulatory Visit (INDEPENDENT_AMBULATORY_CARE_PROVIDER_SITE_OTHER): Payer: Medicaid Other | Admitting: Obstetrics and Gynecology

## 2020-11-21 DIAGNOSIS — Z3046 Encounter for surveillance of implantable subdermal contraceptive: Secondary | ICD-10-CM | POA: Diagnosis not present

## 2020-11-21 DIAGNOSIS — Z975 Presence of (intrauterine) contraceptive device: Secondary | ICD-10-CM | POA: Insufficient documentation

## 2020-11-21 NOTE — Progress Notes (Signed)
Post Partum Visit Note  Tricia Morrison is a 20 y.o. G24P1001 female who presents for a postpartum visit. She is 4 weeks postpartum following a normal spontaneous vaginal delivery.  I have fully reviewed the prenatal and intrapartum course. The delivery was at 39.5 gestational weeks.  Anesthesia: epidural. Postpartum course has been uncomplicated. Baby is doing well. Baby is feeding by breast. Bleeding no bleeding. Bowel function is normal. Bladder function is normal. Patient is sexually active. Contraception method is Nexplanon. Postpartum depression screening: negative.   The pregnancy intention screening data noted above was reviewed. Potential methods of contraception were discussed. The patient elected to proceed with postpartum nexplanon.   Edinburgh Postnatal Depression Scale - 11/21/20 1512       Edinburgh Postnatal Depression Scale:  In the Past 7 Days   I have been able to laugh and see the funny side of things. 0    I have looked forward with enjoyment to things. 0    I have blamed myself unnecessarily when things went wrong. 0    I have been anxious or worried for no good reason. 2    I have felt scared or panicky for no good reason. 2    Things have been getting on top of me. 0    I have been so unhappy that I have had difficulty sleeping. 0    I have felt sad or miserable. 0    I have been so unhappy that I have been crying. 0    The thought of harming myself has occurred to me. 0    Edinburgh Postnatal Depression Scale Total 4             Health Maintenance Due  Topic Date Due   COVID-19 Vaccine (1) Never done   HPV VACCINES (1 - 2-dose series) Never done   TETANUS/TDAP  Never done   PAP-Cervical Cytology Screening  Never done   PAP SMEAR-Modifier  Never done   INFLUENZA VACCINE  08/08/2020    The following portions of the patient's history were reviewed and updated as appropriate: allergies, current medications, past family history, past medical history,  past social history, past surgical history, and problem list.  Review of Systems Pertinent items are noted in HPI.  Objective:  BP 113/76   Pulse 62   Ht 5\' 1"  (1.549 m)   Wt 166 lb (75.3 kg)   Breastfeeding Yes   BMI 31.37 kg/m    General:  alert, cooperative, and no distress   Breasts:  not indicated  Lungs: clear to auscultation bilaterally  Heart:  regular rate and rhythm  Abdomen: soft, non-tender; bowel sounds normal; no masses,  no organomegaly   Wound N/a   GU exam:  normal      Pap smear taken without incident Nexplanon device palpated in left arm Assessment:    normal postpartum exam.  Presence of subdermal contraceptive  Plan:   Essential components of care per ACOG recommendations:  1.  Mood and well being: Patient with negative depression screening today. Reviewed local resources for support.  - Patient tobacco use? No.   - hx of drug use? No.    2. Infant care and feeding:  -Patient currently breastmilk feeding? Yes. Discussed returning to work and pumping. Reviewed importance of draining breast regularly to support lactation.  -Social determinants of health (SDOH) reviewed in EPIC.   3. Sexuality, contraception and birth spacing - Patient does not want a pregnancy in the next  year.  Desired family size is 3 children.  - Reviewed forms of contraception in tiered fashion. Patient desired  nexplanon  today.   - Discussed birth spacing of 18 months  4. Sleep and fatigue -Encouraged family/partner/community support of 4 hrs of uninterrupted sleep to help with mood and fatigue  5. Physical Recovery  - Discussed patients delivery and complications. She describes her labor as good. - Patient had a Vaginal, no problems at delivery. Patient had a  labial and   perineal laceration. Perineal healing reviewed. Patient expressed understanding - Patient has urinary incontinence? No. - Patient is safe to resume physical and sexual activity  6.  Health  Maintenance - HM due items addressed Yes - Last pap smear never done due to age.Pap smear done at today's visit.  -Breast Cancer screening indicated? No.   7. Chronic Disease/Pregnancy Condition follow up:  gestational hypertension, BP normal today  - PCP follow up   F/u in 1 year or prn Warden Fillers, MD [facility] [employer], [facility]

## 2020-11-22 ENCOUNTER — Other Ambulatory Visit (HOSPITAL_COMMUNITY)
Admission: RE | Admit: 2020-11-22 | Discharge: 2020-11-22 | Disposition: A | Discharge status: Home / Self Care | Payer: Medicaid Other | Source: Ambulatory Visit | Attending: Obstetrics | Admitting: Obstetrics

## 2020-11-22 NOTE — Addendum Note (Signed)
Addended by: Marya Landry D on: 11/22/2020 10:12 AM   Modules accepted: Orders

## 2020-11-23 LAB — CYTOLOGY - PAP: Diagnosis: NEGATIVE

## 2020-11-28 ENCOUNTER — Other Ambulatory Visit: Payer: Self-pay

## 2021-04-22 IMAGING — US US OB LIMITED
1 series · 4 of 4 positions shown · non-contrast
Comparison: none

[Series 1: us ob limited · 4 of 4 slices shown]
[im 1/4]
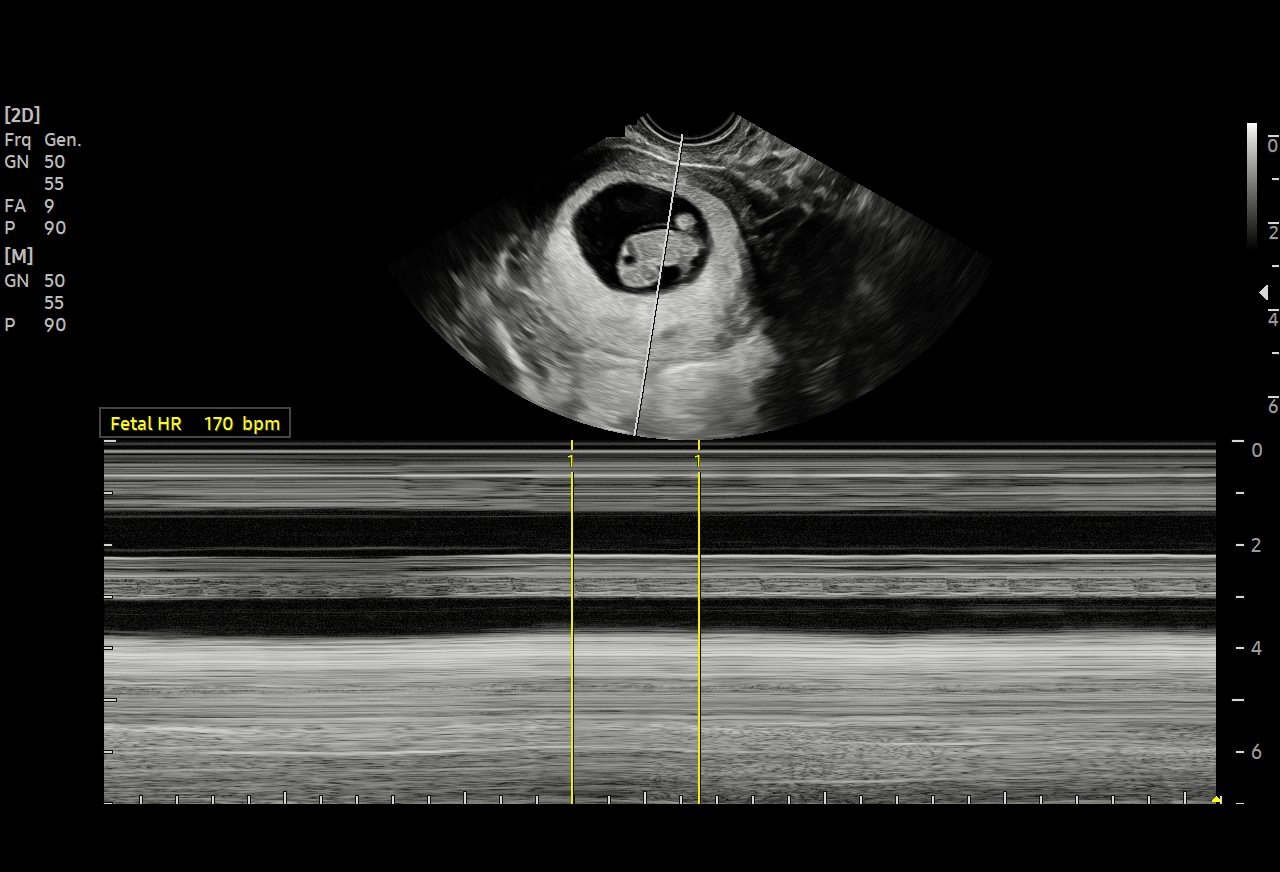
[im 2/4]
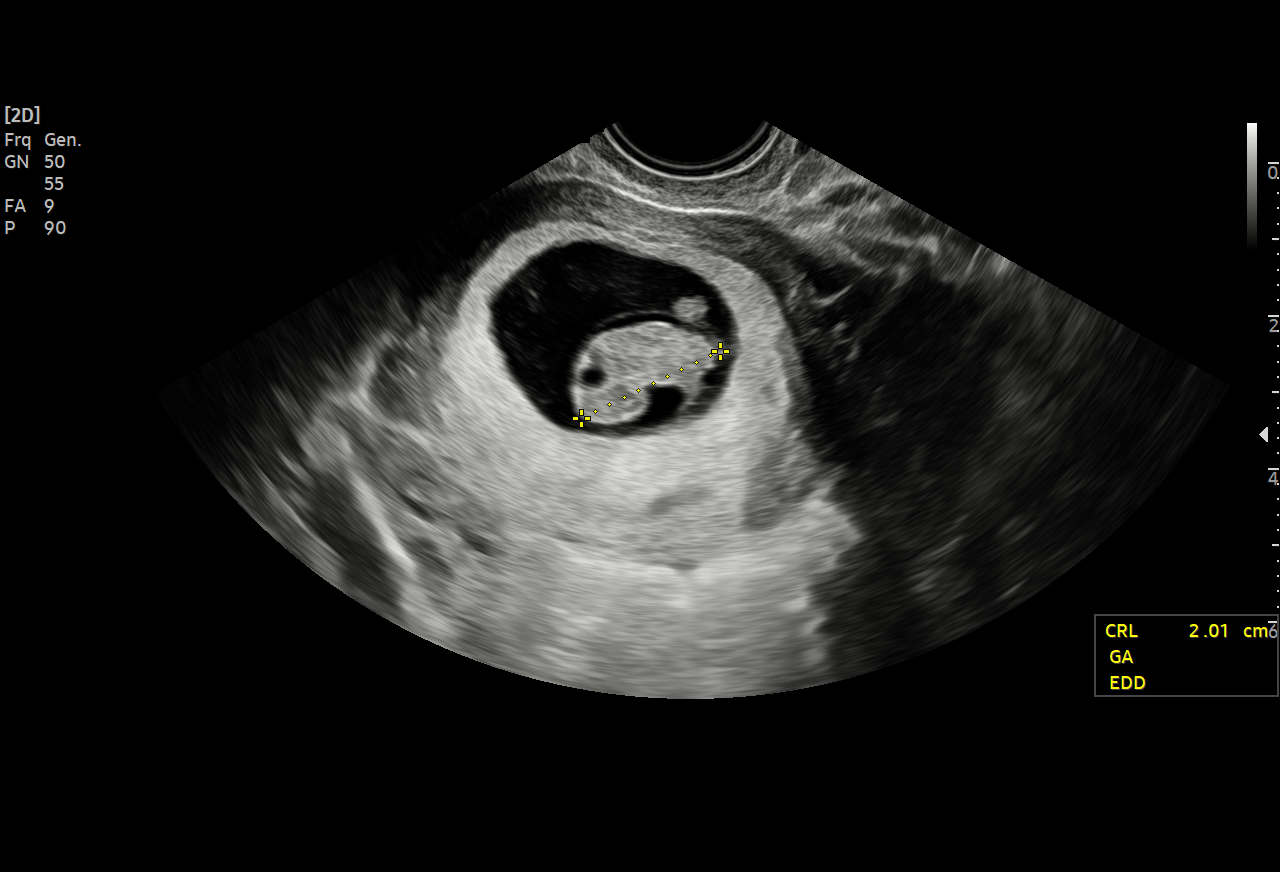
[im 3/4]
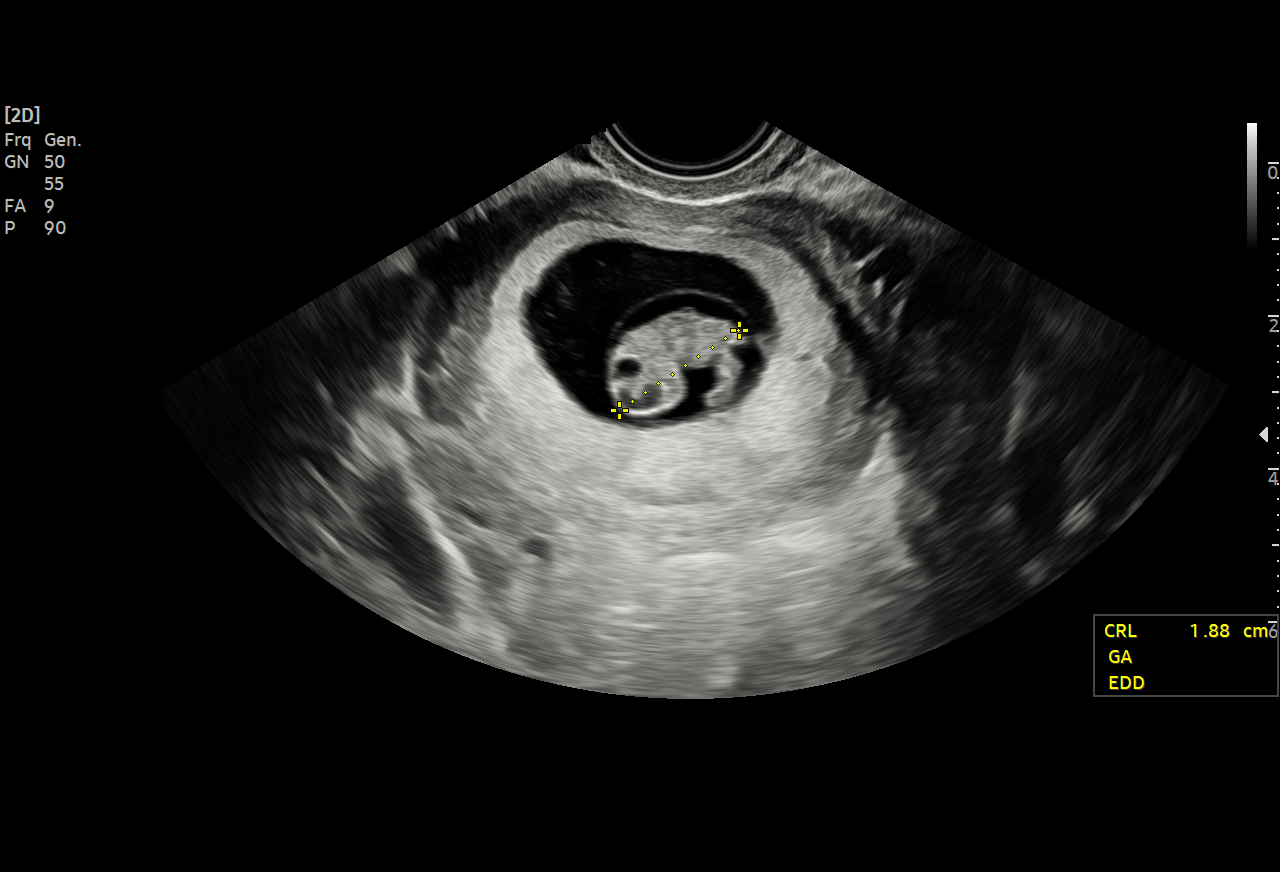
[im 4/4]
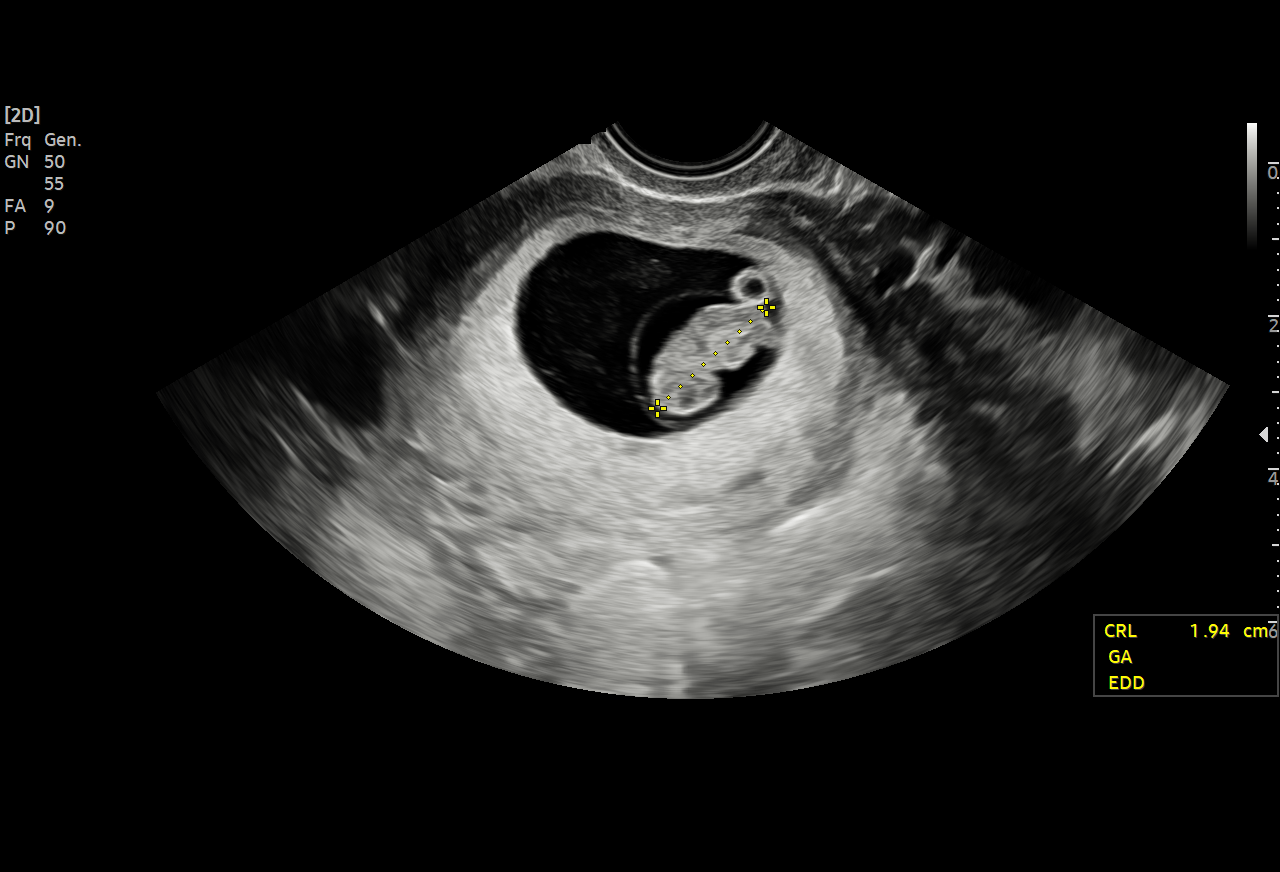

[4 of 4 positions shown; findings below may reference images not displayed]

Healthcare

 1   [HOSPITAL]                        [address].0      KIM WAN MASS

Indications

 8 weeks gestation of pregnancy
 Encounter for uncertain dates
Fetal Evaluation

 Num Of Fetuses:          1
 Fetal Heart Rate(bpm):   170
 Cardiac Activity:        Observed
Biometry

 CRL:      19.4   mm     G. Age:  8w 2d                    EDD:   09/10/20
Gestational Age

 Best:           8w 2d     Det. By:  U/S C R L (02/01/20)       EDD:  09/10/20
Comments

 Single live IUP at 8w2d by CRL. Unsure LMP 11/23/19
Impression

 Viable intrauterine pregnancy
Recommendations

 Routine prenatal care
                 Ya, Joni Jona

## 2021-07-08 IMAGING — US US MFM OB DETAIL+14 WK
1 series · 13 of 28 positions shown · non-contrast
Comparison: none

[Series 1: us mfm ob detail+14 wk · 13 of 89 slices shown]
[im 4/89]
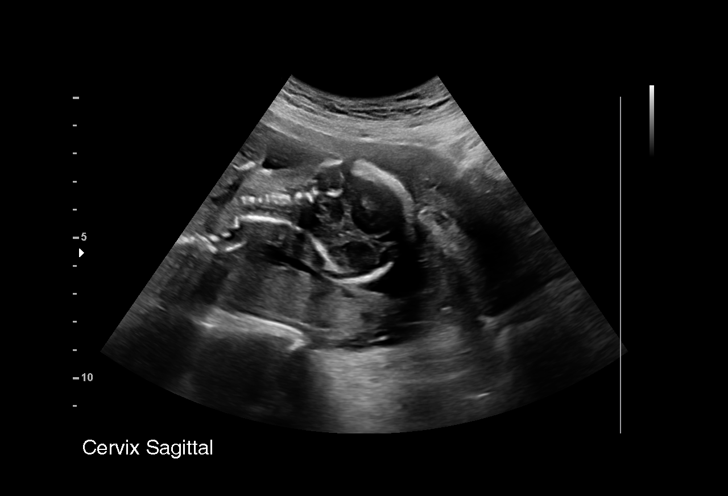
[im 10/89]
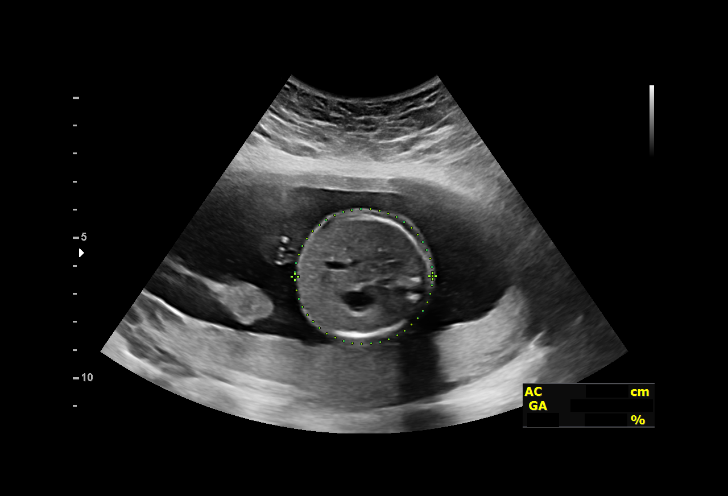
[im 17/89]
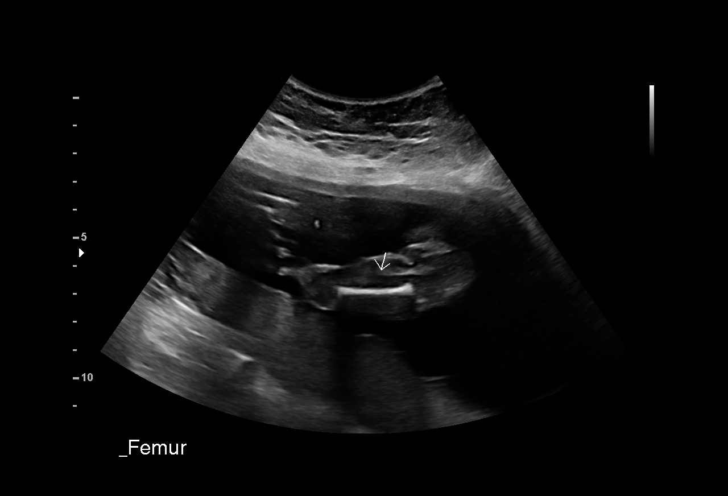
[im 23/89]
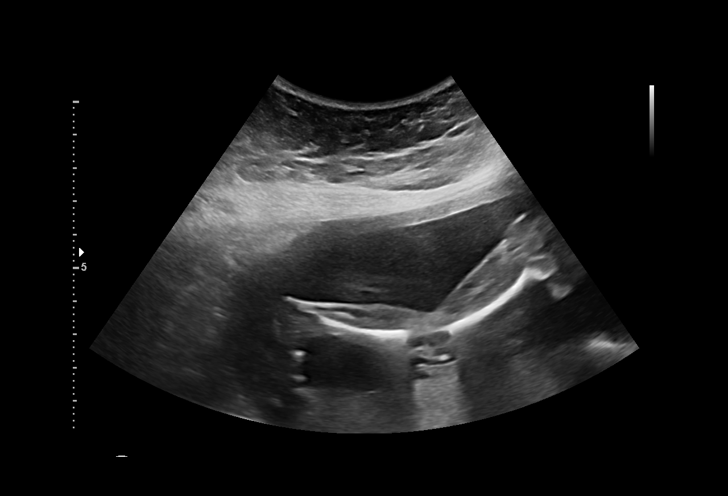
[im 30/89]
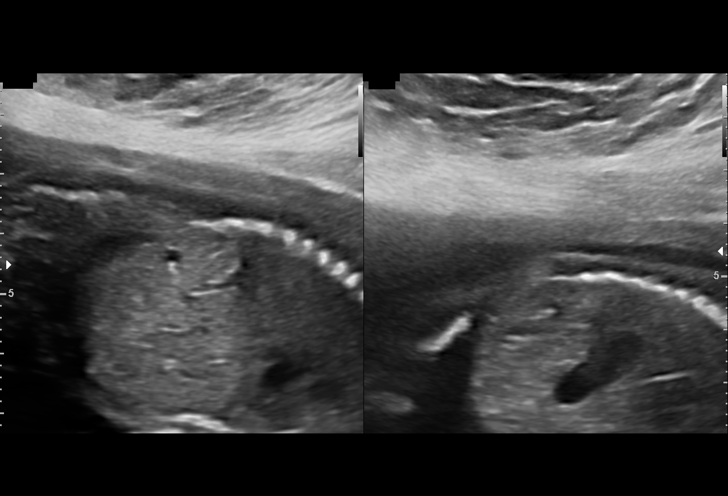
[im 36/89]
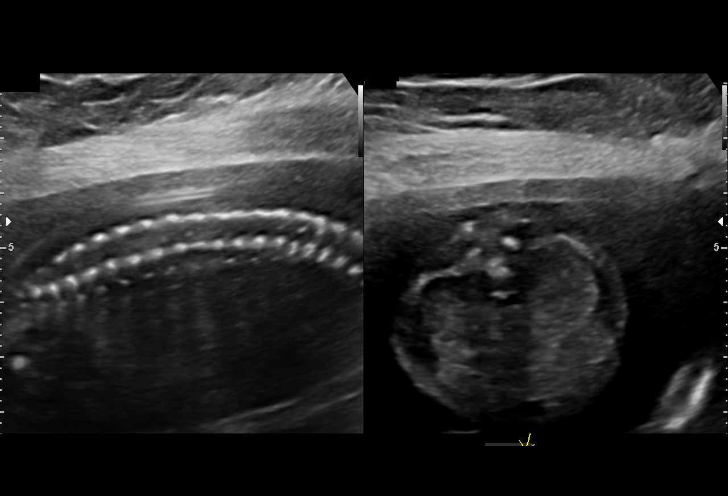
[im 46/89]
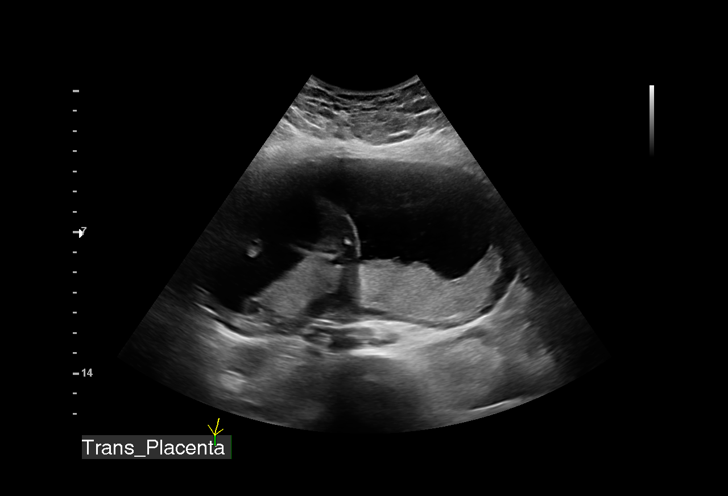
[im 53/89]
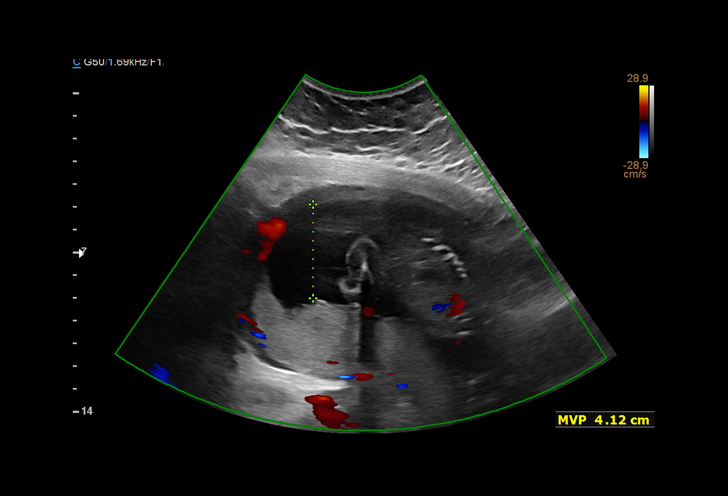
[im 59/89]
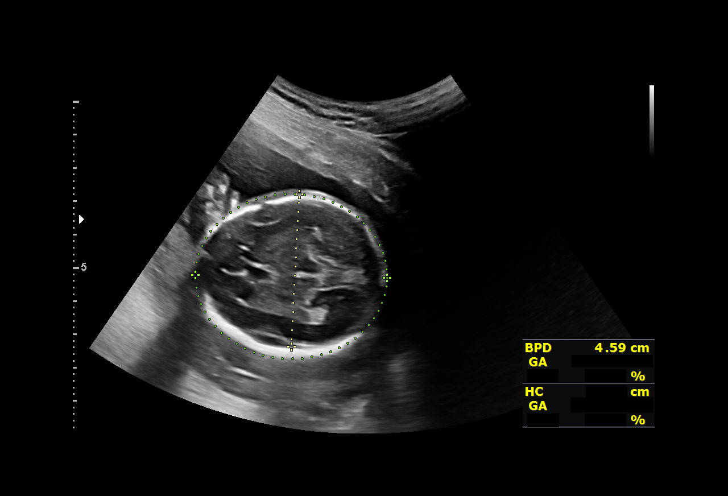
[im 66/89]
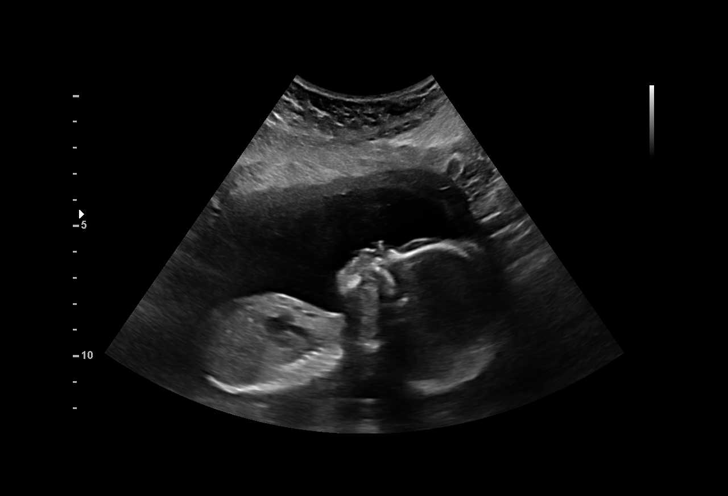
[im 72/89]
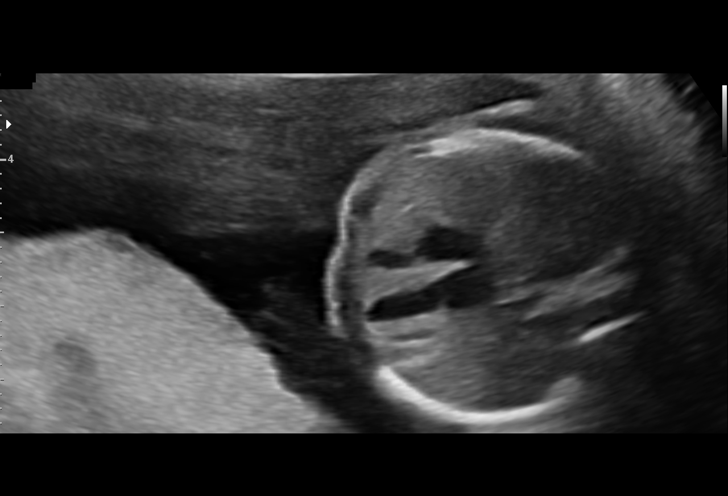
[im 79/89]
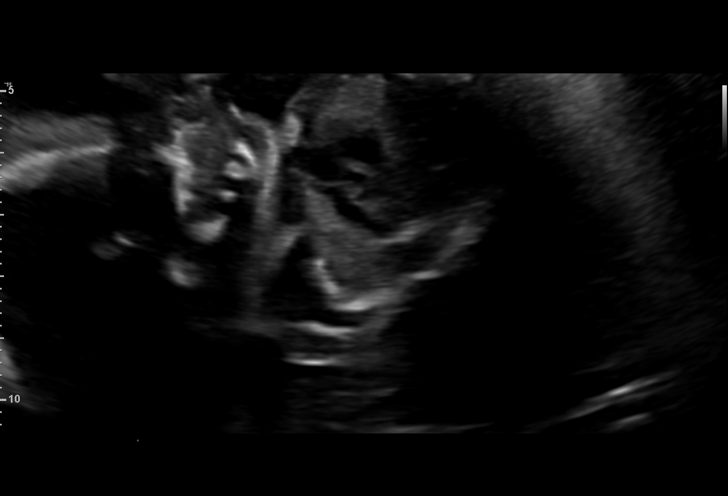
[im 85/89]
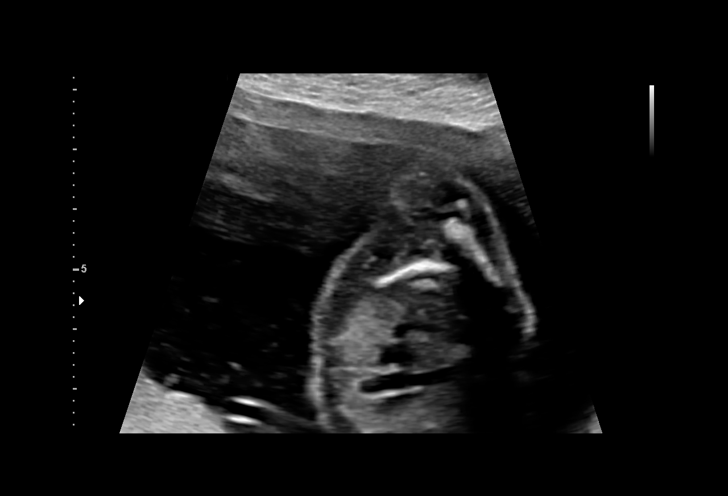

[13 of 28 positions shown; findings below may reference images not displayed]

Indications

 Obesity complicating pregnancy, second
 trimester (Pre Preg BMI 37)
 Genetic carrier (Silent carrier for Redlow Sengteax,X8Y.A
 carrier for spinal muscular atrophy)
 19 weeks gestation of pregnancy
 LR NIPS
 Encounter for antenatal screening for
 malformations
Vital Signs

                                                Height:        5'1"
Fetal Evaluation

 Num Of Fetuses:         1
 Fetal Heart Rate(bpm):  148
 Cardiac Activity:       Observed
 Presentation:           Cephalic
 Placenta:               Posterior
 P. Cord Insertion:      Not well visualized

 Amniotic Fluid
 AFI FV:      Within normal limits

                             Largest Pocket(cm)

Biometry
 BPD:      46.2  mm     G. Age:  20w 0d         78  %    CI:        76.38   %    70 - 86
                                                         FL/HC:      18.1   %    16.1 -
 HC:      167.5  mm     G. Age:  19w 3d         49  %    HC/AC:      1.09        1.09 -
 AC:       153   mm     G. Age:  20w 4d         83  %    FL/BPD:     65.8   %
 FL:       30.4  mm     G. Age:  19w 3d         47  %    FL/AC:      19.9   %    20 - 24
 HUM:      29.2  mm     G. Age:  19w 4d         57  %
 CER:      20.5  mm     G. Age:  19w 5d         65  %
 NFT:       4.4  mm

 LV:        7.1  mm
 CM:          4  mm

 Est. FW:     322  gm    0 lb 11 oz      82  %
OB History

 Gravidity:    1
Gestational Age

 LMP:           21w 0d        Date:  11/23/19                 EDD:   08/29/20
 U/S Today:     19w 6d                                        EDD:   09/06/20
 Best:          19w 2d     Det. By:  Early Ultrasound         EDD:   09/10/20
                                     (02/01/20)
Anatomy

 Cranium:               Appears normal         LVOT:                   Not well visualized
 Cavum:                 Appears normal         Aortic Arch:            Not well visualized
 Ventricles:            Appears normal         Ductal Arch:            Appears normal
 Choroid Plexus:        Appears normal         Diaphragm:              Appears normal
 Cerebellum:            Appears normal         Stomach:                Appears normal, left
                                                                       sided
 Posterior Fossa:       Appears normal         Abdomen:                Appears normal
 Nuchal Fold:           Appears normal         Abdominal Wall:         Appears nml (cord
                                                                       insert, abd wall)
 Face:                  Appears normal         Cord Vessels:           Appears normal (3
                        (orbits and profile)                           vessel cord)
 Lips:                  Appears normal         Kidneys:                Appear normal
 Palate:                Not well visualized    Bladder:                Appears normal
 Thoracic:              Appears normal         Spine:                  Ltd views no
                                                                       intracranial signs of
                                                                       NTD
 Heart:                 Not well visualized    Upper Extremities:      Appears normal
 RVOT:                  Not well visualized    Lower Extremities:      Appears normal

 Other:  Fetus appears to be a male. Heels and RT 5th digit visualized. Nasal
         bone visualized. Technically difficult due to maternal habitus and fetal
         position.
Cervix Uterus Adnexa

 Cervix
 Length:           3.38  cm.
 Normal appearance by transabdominal scan.
Impression

 Single intrauterine pregnancy here for a detailed anatomy
 due elevated BMI, SMA and alpha thalassemia carrier status.
 Normal anatomy with measurements consistent with dates
 There is good fetal movement and amniotic fluid volume
 Suboptimal views of the fetal anatomy were obtained
 secondary to fetal position.

 I discussed her carrier status results and offered genetic
 counseling. She opted to obtain genetic counseling this was
 scheduled.
Recommendations

 Follow up growth in 6-8 weeks.

## 2021-11-04 IMAGING — US US MFM OB FOLLOW-UP
1 series · 13 of 28 positions shown · non-contrast
Comparison: none

[Series 1: us mfm ob follow-up · 37 acquisitions, 13 frames shown]
[im 2/37]
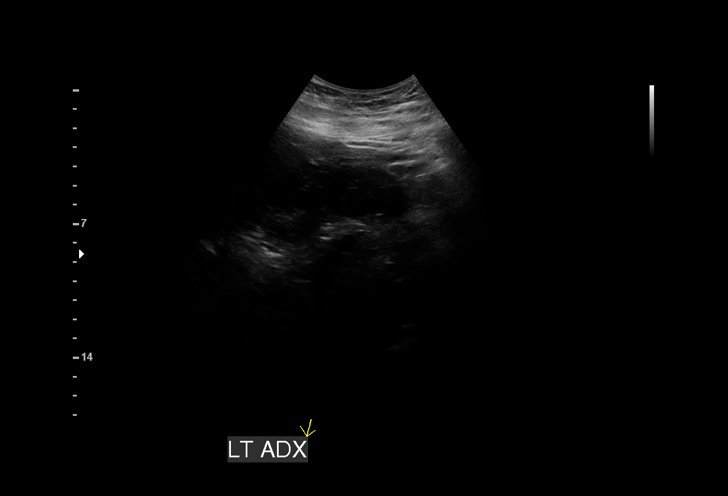
[im 5/37]
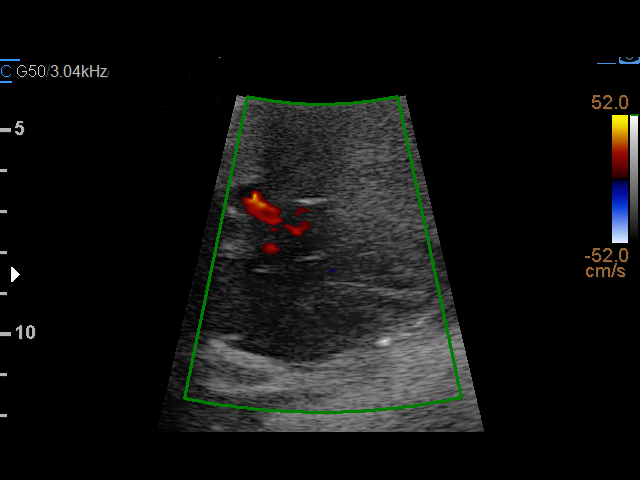
[im 7/37]
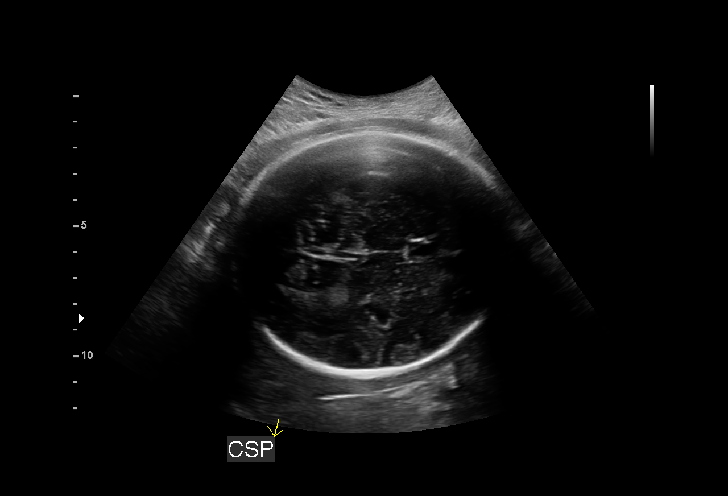
[im 10/37]
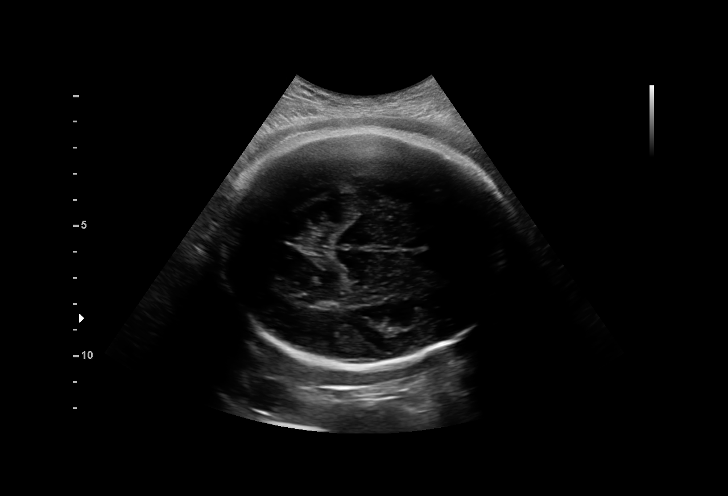
[im 13/37]
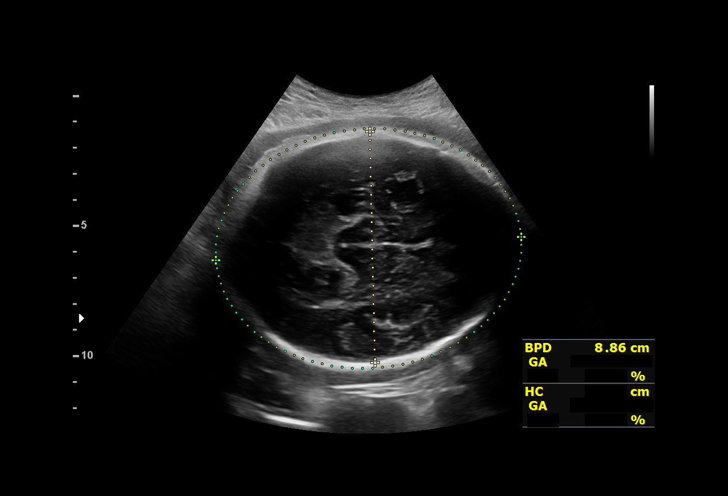
[im 15/37]
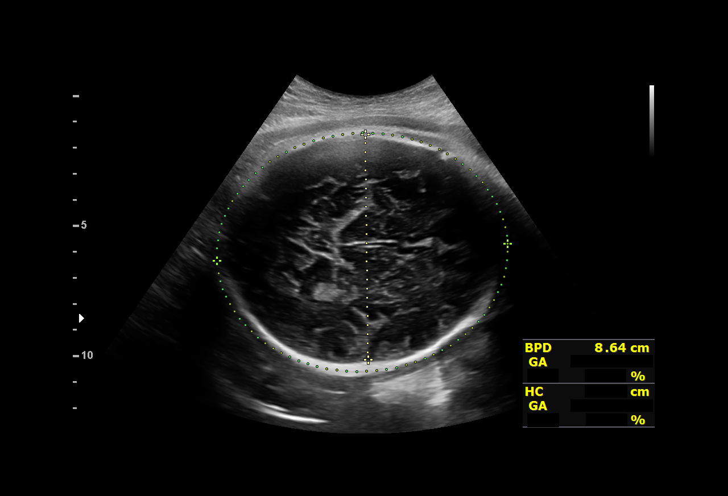
[im 19/37]
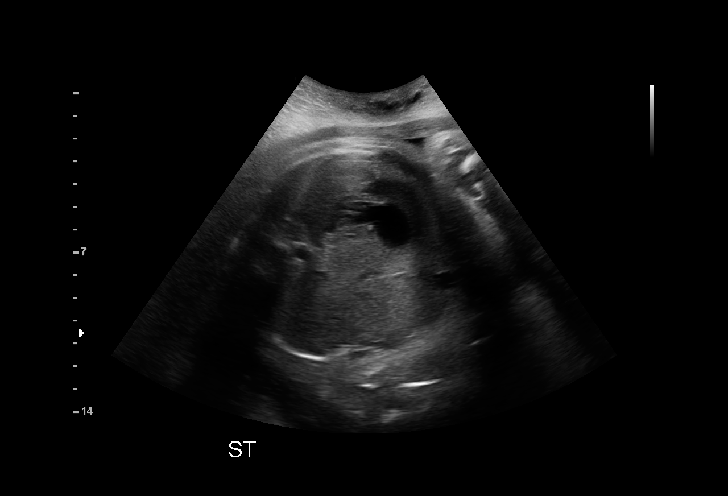
[im 22/37]
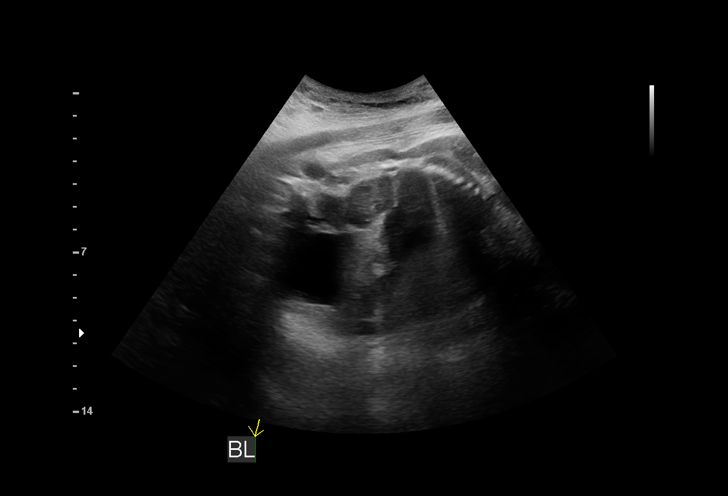
[im 25/37]
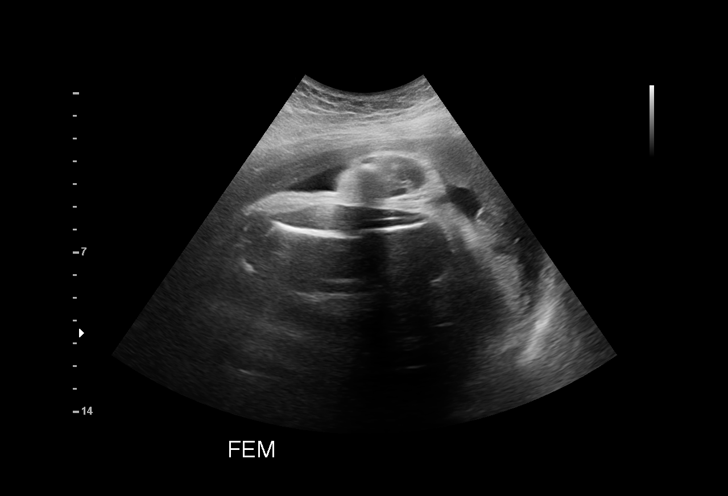
[im 27/37]
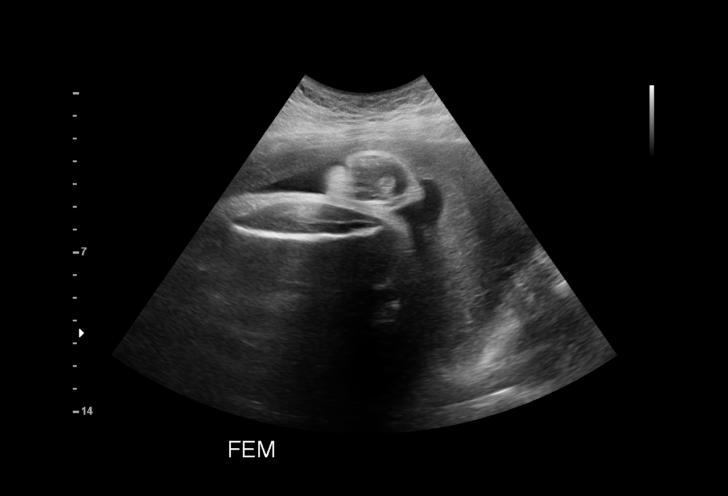
[im 30/37]
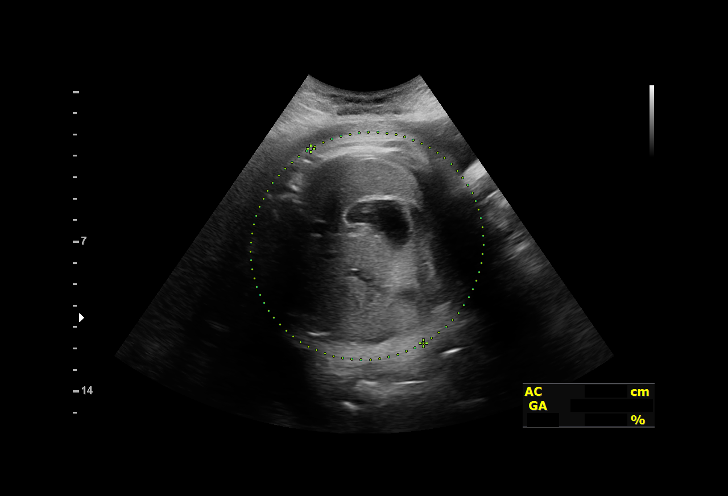
[im 33/37]
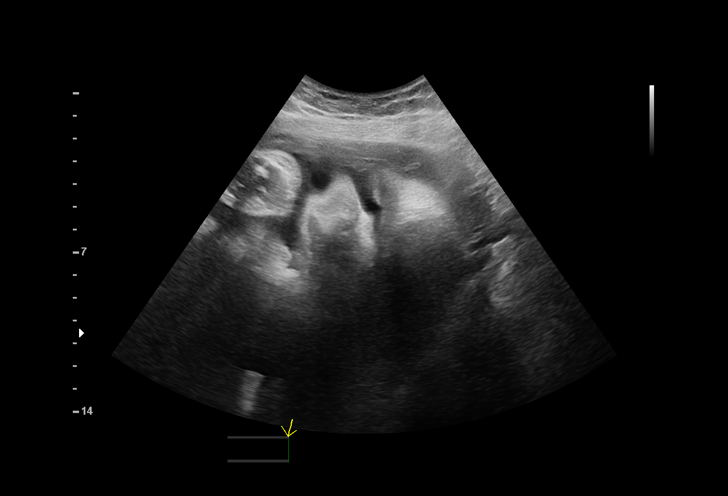
[im 35/37]
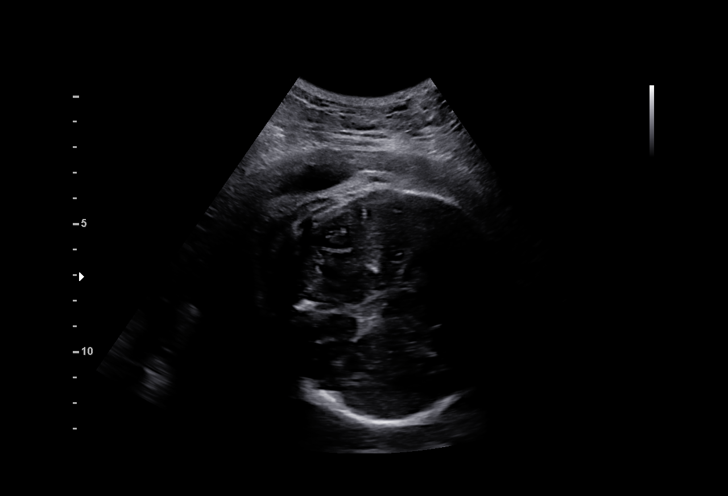

[13 of 28 positions shown; findings below may reference images not displayed]

GITTO

Indications

 Obesity complicating pregnancy, third
 trimester (BMI 38)
 36 weeks gestation of pregnancy
 Genetic carrier (Silent carrier for Chiweng Rogagan,DQ5.2
 carrier for spinal muscular atrophy)
 LR NIPS(Negative AFP)
 Large for gestational age fetus affecting
 management of mother
 Encounter for other antenatal screening
 follow-up
 Anemia during pregnancy in third trimester
Vital Signs

                                                Height:        5'1"
Fetal Evaluation

 Num Of Fetuses:         1
 Preg. Location:         Intrauterine
 Fetal Heart Rate(bpm):  148
 Cardiac Activity:       Observed
 Presentation:           Cephalic
 Placenta:               Posterior

 Amniotic Fluid
 AFI FV:      Within normal limits

 AFI Sum(cm)     %Tile       Largest Pocket(cm)
 9               15          5
 RUQ(cm)       RLQ(cm)       LUQ(cm)        LLQ(cm)
 0             0             4              5
Biometry

 BPD:        88  mm     G. Age:  35w 4d         39  %    CI:        72.97   %    70 - 86
                                                         FL/HC:      21.9   %    20.1 -
 HC:      327.5  mm     G. Age:  37w 1d         42  %    HC/AC:      0.97        0.93 -
 AC:      336.1  mm     G. Age:  37w 4d         89  %    FL/BPD:     81.5   %    71 - 87
 FL:       71.7  mm     G. Age:  36w 5d         58  %    FL/AC:      21.3   %    20 - 24

 LV:        5.5  mm
 Est. FW:    5441  gm    6 lb 14 oz      74  %
OB History

 Gravidity:    1
Gestational Age

 LMP:           38w 0d        Date:  11/23/19                 EDD:   08/29/20
 U/S Today:     36w 5d                                        EDD:   09/07/20
 Best:          36w 2d     Det. By:  Early Ultrasound         EDD:   09/10/20
                                     (02/01/20)
Anatomy

 Cranium:               Appears normal         LVOT:                   Previously seen
 Cavum:                 Appears normal         Aortic Arch:            Previously seen
 Ventricles:            Appears normal         Ductal Arch:            Previously seen
 Choroid Plexus:        Previously seen        Diaphragm:              Previously seen
 Cerebellum:            Previously seen        Stomach:                Appears normal, left
                                                                       sided
 Posterior Fossa:       Previously seen        Abdomen:                Previously seen
 Nuchal Fold:           Previously seen        Abdominal Wall:         Previously seen
 Face:                  Orbits and profile     Cord Vessels:           Previously seen
                        previously seen
 Lips:                  Previously seen        Kidneys:                Appear normal
 Palate:                Not well visualized    Bladder:                Appears normal
 Thoracic:              Previously seen        Spine:                  Previously seen
 Heart:                 Previously seen        Upper Extremities:      Previously seen
 RVOT:                  Previously seen        Lower Extremities:      Previously seen

 Other:  Fetus appears to be a male. Heels, RT 5th digit, and Nasal bone
         previously visualized. Technically difficult due to maternal habitus and
         fetal position.
Cervix Uterus Adnexa

 Cervix
 Not visualized (advanced GA >93wks)

 Uterus
 No abnormality visualized.

 Right Ovary
 Not visualized.
 Left Ovary
 Not visualized.

 Cul De Sac
 No free fluid seen.

 Adnexa
 No abnormality visualized.
Comments

 This patient was seen for a follow up growth scan due to
 maternal obesity.  She denies any problems since her last
 exam.
 She was informed that the fetal growth and amniotic fluid
 level appears appropriate for her gestational age.
 As the fetal growth is within normal limits, no further exams
 were scheduled in our office.

## 2023-10-05 ENCOUNTER — Ambulatory Visit
Admission: EM | Admit: 2023-10-05 | Discharge: 2023-10-05 | Disposition: A | Discharge status: Home / Self Care | Attending: Family Medicine | Admitting: Family Medicine

## 2023-10-05 DIAGNOSIS — Z113 Encounter for screening for infections with a predominantly sexual mode of transmission: Secondary | ICD-10-CM | POA: Insufficient documentation

## 2023-10-05 HISTORY — DX: Iron deficiency: E61.1

## 2023-10-05 HISTORY — DX: Essential (primary) hypertension: I10

## 2023-10-05 LAB — POCT URINE PREGNANCY: Preg Test, Ur: NEGATIVE

## 2023-10-05 NOTE — ED Triage Notes (Signed)
 Pt requesting STD testing and preg test-NAD-steady gait

## 2023-10-05 NOTE — ED Provider Notes (Signed)
 [facility]  Note:  This document was prepared using Conservation officer, historic buildings and may include unintentional dictation errors.  MRN: [id number] DOB: [birth year]  Subjective:   Tricia Morrison is a 23 y.o. female presenting for STI screen, urine pregnancy test.  Denies fever, n/v, abdominal pain, pelvic pain, rashes, dysuria, urinary frequency, hematuria, vaginal discharge.  No known exposures.  No current facility-administered medications for this encounter.  Current Outpatient Medications:    acetaminophen  (TYLENOL ) 325 MG tablet, Take 2 tablets (650 mg total) by mouth every 4 (four) hours as needed (for pain scale < 4)., Disp: , Rfl:    Blood Pressure Monitoring (BLOOD PRESSURE KIT) DEVI, 1 kit by Does not apply route once a week. (Patient not taking: Reported on 09/07/2020), Disp: 1 each, Rfl: 0   docusate sodium  (COLACE) 100 MG capsule, Take 1 capsule (100 mg total) by mouth 2 (two) times daily. (Patient not taking: Reported on 09/06/2020), Disp: 60 capsule, Rfl: 0   ferrous sulfate  325 (65 FE) MG tablet, Take 1 tablet (325 mg total) by mouth every other day. (Patient not taking: Reported on 09/06/2020), Disp: 30 tablet, Rfl: 4   ibuprofen  (ADVIL ) 600 MG tablet, Take 1 tablet (600 mg total) by mouth every 6 (six) hours., Disp: 30 tablet, Rfl: 0   No Known Allergies  Past Medical History:  Diagnosis Date   Hypertension    Low iron    per pt     Past Surgical History:  Procedure Laterality Date   LEG SURGERY Right    femur fx repair    Family History  Problem Relation Age of Onset   Healthy Mother    Healthy Father     Social History   Tobacco Use   Smoking status: Every Day    Types: Cigars   Smokeless tobacco: Never   Tobacco comments:    cigars/ last smoked last week  Vaping Use   Vaping status: Never Used  Substance Use Topics   Alcohol use: Yes    Comment: occ   Drug use: Not Currently    Types: Marijuana     ROS   Objective:   Vitals: BP 122/72 (BP Location: Right Arm)   Pulse 86   Temp 98.8 F (37.1 C) (Oral)   Resp 16   LMP 09/21/2023   SpO2 99%   Physical Exam Constitutional:      General: She is not in acute distress.    Appearance: Normal appearance. She is well-developed. She is not ill-appearing, toxic-appearing or diaphoretic.  HENT:     Head: Normocephalic and atraumatic.     Nose: Nose normal.     Mouth/Throat:     Mouth: Mucous membranes are moist.  Eyes:     General: No scleral icterus.       Right eye: No discharge.        Left eye: No discharge.     Extraocular Movements: Extraocular movements intact.  Cardiovascular:     Rate and Rhythm: Normal rate.  Pulmonary:     Effort: Pulmonary effort is normal.  Skin:    General: Skin is warm and dry.  Neurological:     General: No focal deficit present.     Mental Status: She is alert and oriented to person, place, and time.  Psychiatric:        Mood and Affect: Mood normal.        Behavior: Behavior normal.     Results for  orders placed or performed during the hospital encounter of 10/05/23 (from the past 24 hours)  POCT urine pregnancy     Status: None   Collection Time: 10/05/23 12:39 PM  Result Value Ref Range   Preg Test, Ur Negative Negative    Assessment and Plan :   PDMP not reviewed this encounter.  1. Screen for STD (sexually transmitted disease)    STI check pending, will treat as appropriate based off of lab results.   Christopher Savannah, PA-C 10/05/23 1256

## 2023-10-07 ENCOUNTER — Ambulatory Visit (HOSPITAL_COMMUNITY): Payer: Self-pay

## 2023-10-07 LAB — CERVICOVAGINAL ANCILLARY ONLY
Chlamydia: POSITIVE — AB
Comment: NEGATIVE
Comment: NEGATIVE
Comment: NORMAL
Neisseria Gonorrhea: NEGATIVE
Trichomonas: NEGATIVE

## 2023-10-07 MED ORDER — DOXYCYCLINE HYCLATE 100 MG PO TABS: 100.0000 mg | ORAL_TABLET | Freq: Two times a day (BID) | ORAL | 0 refills | Status: AC

## 2023-10-08 LAB — RPR: RPR Ser Ql: NONREACTIVE

## 2023-10-08 LAB — HIV ANTIBODY (ROUTINE TESTING W REFLEX): HIV Screen 4th Generation wRfx: NONREACTIVE

## 2023-10-09 LAB — MISC LABCORP TEST (SEND OUT): Labcorp test code: 180076

## 2023-12-23 ENCOUNTER — Ambulatory Visit: Admitting: Obstetrics and Gynecology

## 2023-12-23 ENCOUNTER — Encounter: Admitting: Obstetrics

## 2023-12-23 ENCOUNTER — Encounter: Payer: Self-pay | Admitting: Obstetrics and Gynecology

## 2023-12-23 ENCOUNTER — Encounter: Admitting: Obstetrics and Gynecology

## 2023-12-23 VITALS — BP 109/73 | HR 87 | Ht 62.0 in | Wt 190.0 lb

## 2023-12-23 DIAGNOSIS — Z3046 Encounter for surveillance of implantable subdermal contraceptive: Secondary | ICD-10-CM

## 2023-12-23 MED ORDER — ETONOGESTREL 68 MG ~~LOC~~ IMPL
68.0000 mg | DRUG_IMPLANT | Freq: Once | SUBCUTANEOUS | Status: AC
  Administered 2023-12-23: 15:00:00 68 mg via SUBCUTANEOUS

## 2023-12-23 NOTE — Progress Notes (Signed)
 23 y.o. New GYN presents for Nexplanon  removal and Insertion.  C/o acne.

## 2023-12-23 NOTE — Progress Notes (Signed)
° ° ° °  GYNECOLOGY OFFICE PROCEDURE NOTE  Tricia Morrison is a 23 y.o. G1P1001 here for Nexplanon  removal and Nexplanon  insertion.  Last pap smear was in 2022 and was normal.  Pt will need to reschedule for annual exam/pap.  No other gynecologic concerns.   Nexplanon  Removal and Reinsertion Patient identified, informed consent performed, consent signed.   Patient does understand that irregular bleeding is a very common side effect of this medication. She was advised to have backup contraception for one week after replacement of the implant. Pregnancy test in clinic today was negative.  Appropriate time out taken. Nexplanon  site identified in left arm.  Area prepped in usual sterile fashon. One ml of 1% lidocaine  was used to anesthetize the area at the distal end of the implant. A small stab incision was made right beside the implant on the distal portion. The Nexplanon  rod was grasped using hemostats and removed without difficulty. There was minimal blood loss. There were no complications. Area was then injected with 3 ml of 1 % lidocaine . She was re-prepped with betadine, Nexplanon  removed from packaging, Device confirmed in needle, then inserted full length of needle and withdrawn per handbook instructions. Nexplanon  was able to palpated in the patient's arm; patient palpated the insert herself.  There was minimal blood loss. Patient insertion site covered with gauze and a pressure bandage to reduce any bruising. The patient tolerated the procedure well and was given post procedure instructions.  She was advised to have backup contraception for one week.     F/u in 1 month for annual exam and pap smear to remain current  Jerilynn Buddle, MD, FACOG Obstetrician & Gynecologist, [facility] for [employer], [facility]

## 2024-01-22 ENCOUNTER — Ambulatory Visit: Payer: Self-pay | Admitting: Physician Assistant
